# Patient Record
Sex: Female | Born: 1945 | Race: White | Hispanic: No | Marital: Married | Smoking: Former smoker
Health system: Southern US, Community
[De-identification: ages and names within clinical notes are randomized; demographics above are authoritative.]

## PROBLEM LIST (undated history)

## (undated) DIAGNOSIS — E785 Hyperlipidemia, unspecified: Secondary | ICD-10-CM

## (undated) DIAGNOSIS — R232 Flushing: Secondary | ICD-10-CM

## (undated) DIAGNOSIS — Z800 Family history of malignant neoplasm of digestive organs: Secondary | ICD-10-CM

## (undated) DIAGNOSIS — M1990 Unspecified osteoarthritis, unspecified site: Secondary | ICD-10-CM

## (undated) DIAGNOSIS — K635 Polyp of colon: Secondary | ICD-10-CM

## (undated) HISTORY — DX: Family history of malignant neoplasm of digestive organs: Z80.0

## (undated) HISTORY — DX: Hyperlipidemia, unspecified: E78.5

## (undated) HISTORY — DX: Polyp of colon: K63.5

## (undated) HISTORY — DX: Flushing: R23.2

## (undated) HISTORY — DX: Unspecified osteoarthritis, unspecified site: M19.90

---

## 1999-05-22 ENCOUNTER — Encounter (INDEPENDENT_AMBULATORY_CARE_PROVIDER_SITE_OTHER): Payer: Self-pay

## 1999-05-22 ENCOUNTER — Ambulatory Visit (HOSPITAL_COMMUNITY): Admission: RE | Admit: 1999-05-22 | Discharge: 1999-05-22 | Payer: Self-pay | Admitting: Gastroenterology

## 2003-07-08 ENCOUNTER — Ambulatory Visit (HOSPITAL_COMMUNITY): Admission: RE | Admit: 2003-07-08 | Discharge: 2003-07-08 | Payer: Self-pay | Admitting: Gastroenterology

## 2004-08-07 ENCOUNTER — Other Ambulatory Visit: Admission: RE | Admit: 2004-08-07 | Discharge: 2004-08-07 | Payer: Self-pay | Admitting: Family Medicine

## 2005-08-28 ENCOUNTER — Other Ambulatory Visit: Admission: RE | Admit: 2005-08-28 | Discharge: 2005-08-28 | Payer: Self-pay | Admitting: Family Medicine

## 2008-11-23 ENCOUNTER — Other Ambulatory Visit: Admission: RE | Admit: 2008-11-23 | Discharge: 2008-11-23 | Payer: Self-pay | Admitting: Family Medicine

## 2009-12-13 ENCOUNTER — Observation Stay (HOSPITAL_COMMUNITY): Admission: EM | Admit: 2009-12-13 | Discharge: 2009-12-14 | Payer: Self-pay | Admitting: Emergency Medicine

## 2009-12-29 ENCOUNTER — Encounter: Admission: RE | Admit: 2009-12-29 | Discharge: 2009-12-29 | Payer: Self-pay | Admitting: Gastroenterology

## 2010-08-27 LAB — BASIC METABOLIC PANEL
BUN: 10 mg/dL (ref 6–23)
BUN: 14 mg/dL (ref 6–23)
CO2: 25 mEq/L (ref 19–32)
CO2: 25 mEq/L (ref 19–32)
Calcium: 9.1 mg/dL (ref 8.4–10.5)
Calcium: 9.1 mg/dL (ref 8.4–10.5)
Chloride: 113 mEq/L — ABNORMAL HIGH (ref 96–112)
Creatinine, Ser: 0.84 mg/dL (ref 0.4–1.2)
Creatinine, Ser: 0.89 mg/dL (ref 0.4–1.2)
GFR calc Af Amer: >60 mL/min (ref >60)
GFR calc non Af Amer: >60 mL/min (ref >60)
GFR calc non Af Amer: >60 mL/min (ref >60)
Glucose, Bld: 113 mg/dL — ABNORMAL HIGH (ref 70–99)
Glucose, Bld: 98 mg/dL (ref 70–99)
Potassium: 4.4 mEq/L (ref 3.5–5.1)
Sodium: 139 mEq/L (ref 135–145)
Sodium: 143 mEq/L (ref 135–145)

## 2010-08-27 LAB — COMPREHENSIVE METABOLIC PANEL
CO2: 22 mEq/L (ref 19–32)
Creatinine, Ser: 0.76 mg/dL (ref 0.4–1.2)
Glucose, Bld: 161 mg/dL — ABNORMAL HIGH (ref 70–99)

## 2010-08-27 LAB — CBC
HCT: 40.7 % (ref 36.0–46.0)
Hemoglobin: 15.2 g/dL — ABNORMAL HIGH (ref 12.0–15.0)
MCH: 33.5 pg (ref 26.0–34.0)
MCHC: 34.4 g/dL (ref 30.0–36.0)
MCHC: 34.5 g/dL (ref 30.0–36.0)
MCV: 96.6 fL (ref 78.0–100.0)
MCV: 96.9 fL (ref 78.0–100.0)
Platelets: 179 10*3/uL (ref 150–400)
Platelets: 206 10*3/uL (ref 150–400)
RBC: 4.2 MIL/uL (ref 3.87–5.11)
RDW: 13 % (ref 11.5–15.5)
WBC: 5.9 10*3/uL (ref 4.0–10.5)

## 2010-08-27 LAB — POCT CARDIAC MARKERS
CKMB, poc: 1.1 ng/mL (ref 1.0–8.0)
CKMB, poc: <1 ng/mL — ABNORMAL LOW (ref 1.0–8.0)
Myoglobin, poc: 55.5 ng/mL (ref 12–200)
Myoglobin, poc: 56.7 ng/mL (ref 12–200)
Troponin i, poc: <0.05 ng/mL (ref 0.00–0.09)

## 2010-08-27 LAB — DIFFERENTIAL
Basophils Relative: 1 % (ref 0–1)
Lymphocytes Relative: 37 % (ref 12–46)
Lymphs Abs: 2.2 10*3/uL (ref 0.7–4.0)
Monocytes Relative: 8 % (ref 3–12)
Neutro Abs: 3.1 10*3/uL (ref 1.7–7.7)

## 2010-08-27 LAB — LIPID PANEL
Cholesterol: 187 mg/dL (ref 0–200)
LDL Cholesterol: 111 mg/dL — ABNORMAL HIGH (ref 0–99)
Total CHOL/HDL Ratio: 3.5 RATIO
Triglycerides: 112 mg/dL (ref <150)

## 2010-08-27 LAB — APTT: aPTT: 30 seconds (ref 24–37)

## 2010-08-27 LAB — D-DIMER, QUANTITATIVE: D-Dimer, Quant: <0.22 ug/mL-FEU (ref 0.00–0.48)

## 2010-08-27 LAB — HEMOGLOBIN A1C: Mean Plasma Glucose: 123 mg/dL — ABNORMAL HIGH (ref <117)

## 2010-09-27 ENCOUNTER — Other Ambulatory Visit: Payer: Self-pay | Admitting: Gastroenterology

## 2010-09-27 DIAGNOSIS — K7689 Other specified diseases of liver: Secondary | ICD-10-CM

## 2010-10-02 ENCOUNTER — Ambulatory Visit
Admission: RE | Admit: 2010-10-02 | Discharge: 2010-10-02 | Disposition: A | Discharge status: Home / Self Care | Payer: BC Managed Care – PPO | Source: Ambulatory Visit | Attending: Gastroenterology | Admitting: Gastroenterology

## 2010-10-02 DIAGNOSIS — K7689 Other specified diseases of liver: Secondary | ICD-10-CM

## 2010-10-27 NOTE — Op Note (Signed)
NAME:  Abigail Pruitt, Abigail Pruitt                              ACCOUNT NO.:  [id number]   MEDICAL RECORD NO.:  [id number]                   PATIENT TYPE:  AMB   LOCATION:  ENDO                                 FACILITY:  [facility]   PHYSICIAN:  James L. Malon Kindle., M.D.          DATE OF BIRTH:  January 03, 1946   DATE OF PROCEDURE:  07/08/2003  DATE OF DISCHARGE:                                 OPERATIVE REPORT   PROCEDURE:  Colonoscopy.   MEDICATIONS:  1. Fentanyl 75 mcg IV.  2. Versed 8 mg IV.   SCOPE:  Pediatric Olympus colonoscope.   INDICATIONS:  A patient with previous history of polyps.   DESCRIPTION OF PROCEDURE:  The procedure had been explained to the patient  and consent obtained.  With the patient in the left lateral decubitus  position, the Olympus pediatric colonoscope was inserted and advanced.  The  prep was excellent; we were able to reach the cecum using abdominal pressure  and position changes.  The ileocecal valve and appendiceal orifice were  seen.   The patient had diffuse melanosis coli.  Careful examination of the mucosa  on withdrawal showed no polyps in the ascending, transverse, descending or  sigmoid colon.  No significant diverticulosis.  The rectum was free of  polyps.  The scope was withdrawn and the patient tolerated the procedure  well.   ASSESSMENT:  1. No evidence of further polyps in this woman with previous adenomatous     polyps.  (V12.72)  2. Diffuse melanosis coli.   RECOMMENDATIONS:  I will recommend repeating the procedure in five years and  recommend yearly Hemocult's.                                               James L. Malon Kindle., M.D.    Waldron Session  D:  07/08/2003  T:  07/08/2003  Job:  [id number]   cc:   Talmadge Coventry, M.D.  [address]  [place]  [place] [address]  Fax: [phone]

## 2011-03-15 ENCOUNTER — Other Ambulatory Visit: Payer: Self-pay | Admitting: Gastroenterology

## 2011-03-15 DIAGNOSIS — K7689 Other specified diseases of liver: Secondary | ICD-10-CM

## 2011-03-28 ENCOUNTER — Other Ambulatory Visit: Payer: BC Managed Care – PPO

## 2011-03-29 ENCOUNTER — Ambulatory Visit
Admission: RE | Admit: 2011-03-29 | Discharge: 2011-03-29 | Disposition: A | Discharge status: Home / Self Care | Payer: BC Managed Care – PPO | Source: Ambulatory Visit | Attending: Gastroenterology | Admitting: Gastroenterology

## 2011-03-29 DIAGNOSIS — K7689 Other specified diseases of liver: Secondary | ICD-10-CM

## 2012-07-02 ENCOUNTER — Other Ambulatory Visit: Payer: Self-pay | Admitting: Gastroenterology

## 2012-07-02 DIAGNOSIS — K7689 Other specified diseases of liver: Secondary | ICD-10-CM

## 2012-08-27 ENCOUNTER — Other Ambulatory Visit: Payer: BC Managed Care – PPO

## 2012-10-29 ENCOUNTER — Ambulatory Visit
Admission: RE | Admit: 2012-10-29 | Discharge: 2012-10-29 | Disposition: A | Discharge status: Home / Self Care | Payer: Medicare Other | Source: Ambulatory Visit | Attending: Gastroenterology | Admitting: Gastroenterology

## 2012-10-29 DIAGNOSIS — K7689 Other specified diseases of liver: Secondary | ICD-10-CM

## 2013-01-18 IMAGING — US US ABDOMEN COMPLETE
1 series · 13 of 25 positions shown · non-contrast
Comparison: Abdominal ultrasound 12/29/2009.

CLINICAL DATA: Follow up hepatic cysts. Upper abdominal pain.

COMPLETE ABDOMINAL ULTRASOUND 10/02/2010:

[Series 1: us abdomen complete · 0.20mm/px · 13 of 111 slices shown]
[im 1/111]
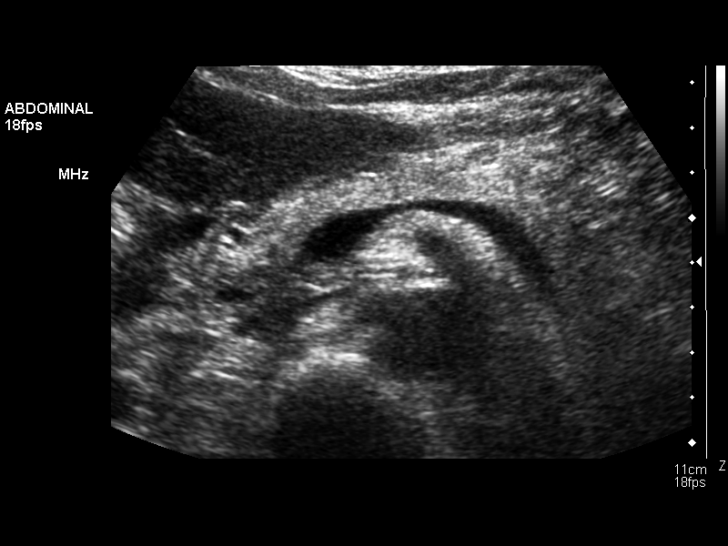
[im 10/111]
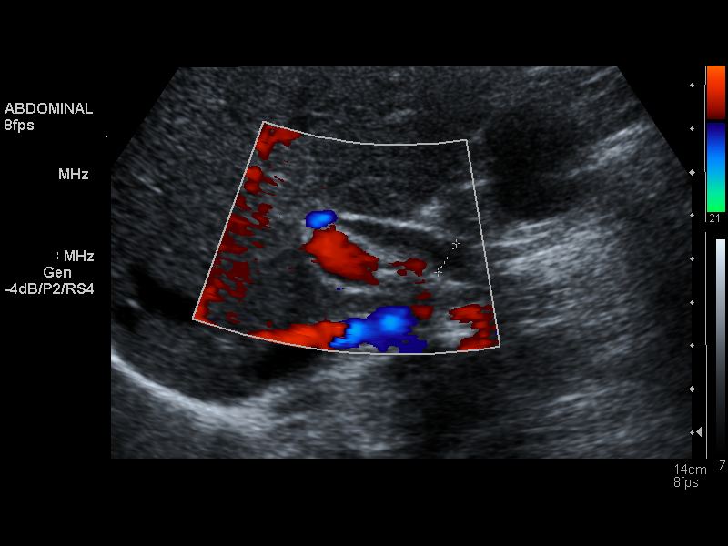
[im 19/111]
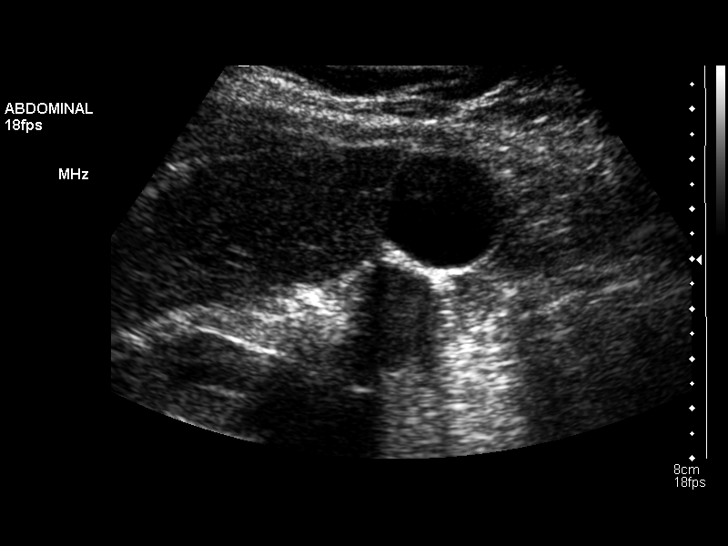
[im 28/111]
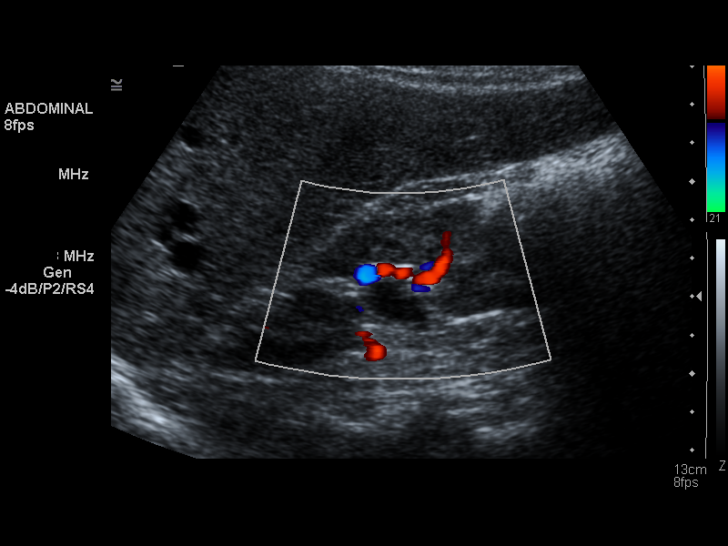
[im 37/111]
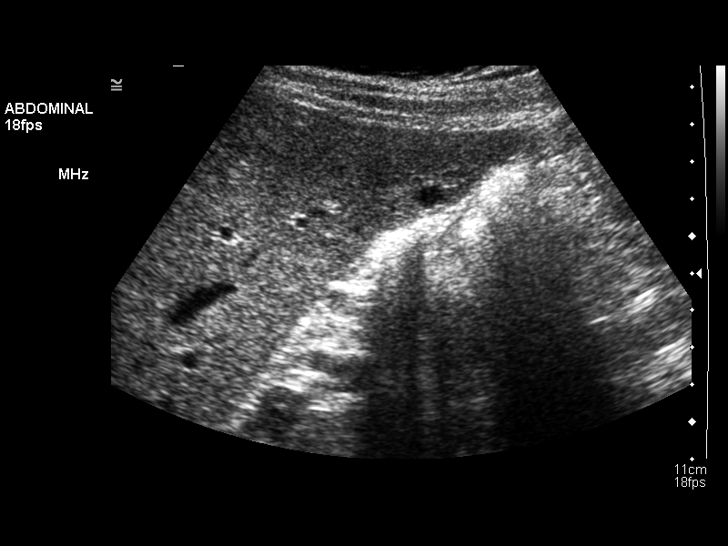
[im 46/111]
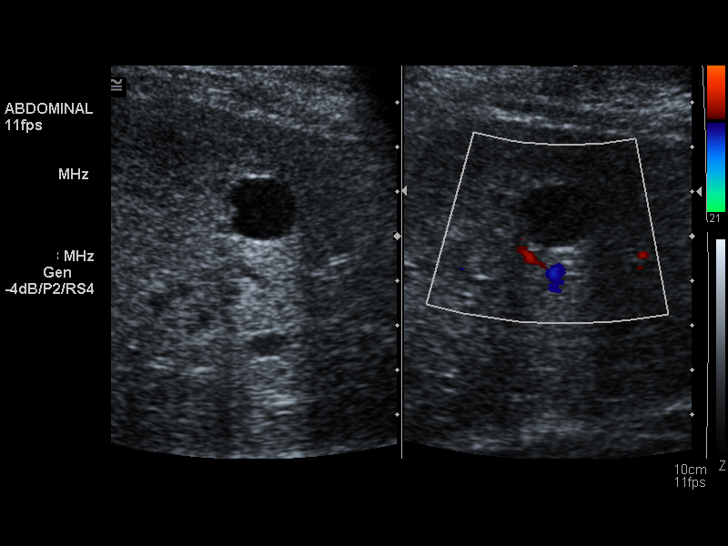
[im 56/111]
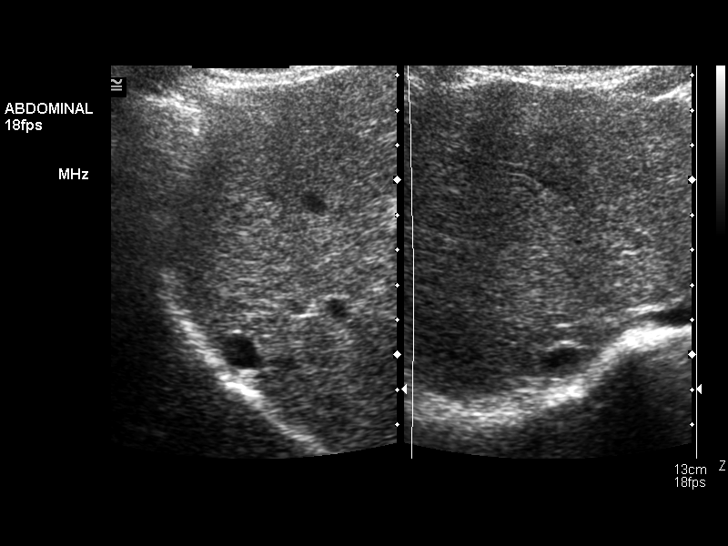
[im 65/111]
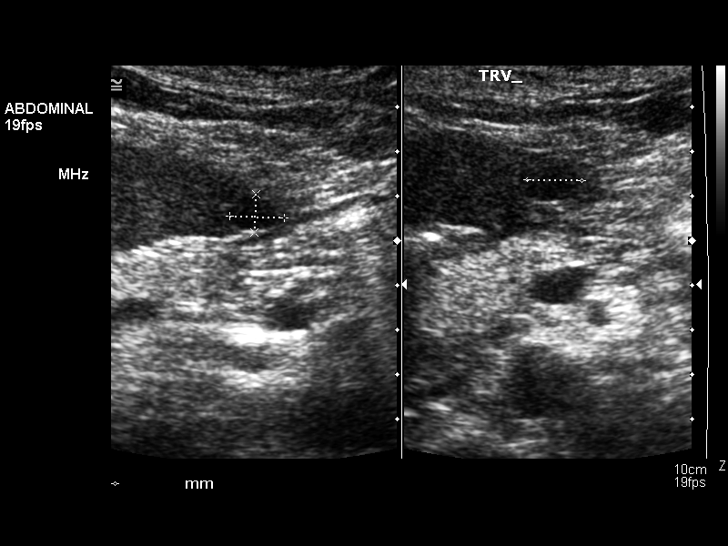
[im 74/111]
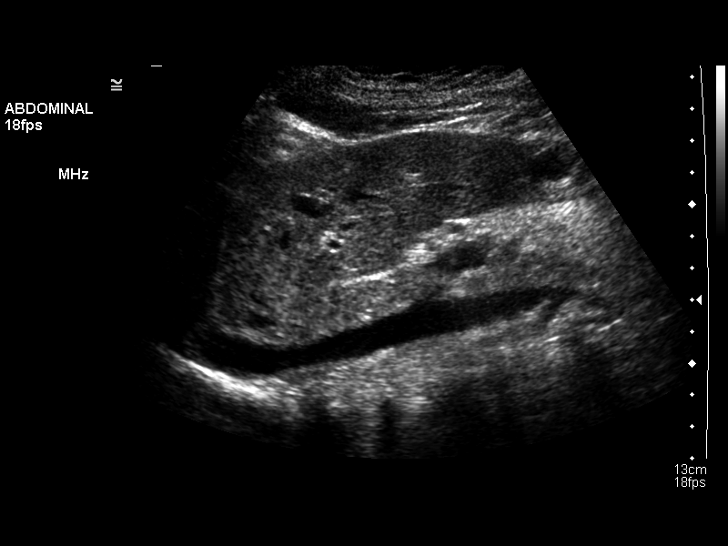
[im 83/111]
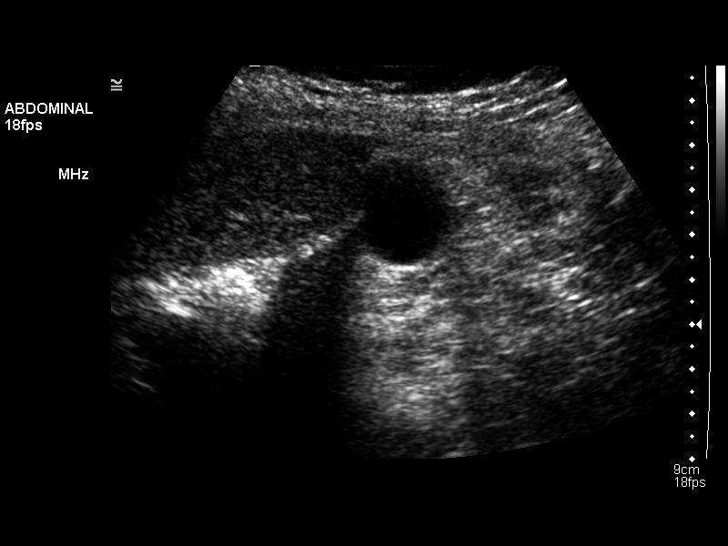
[im 92/111]
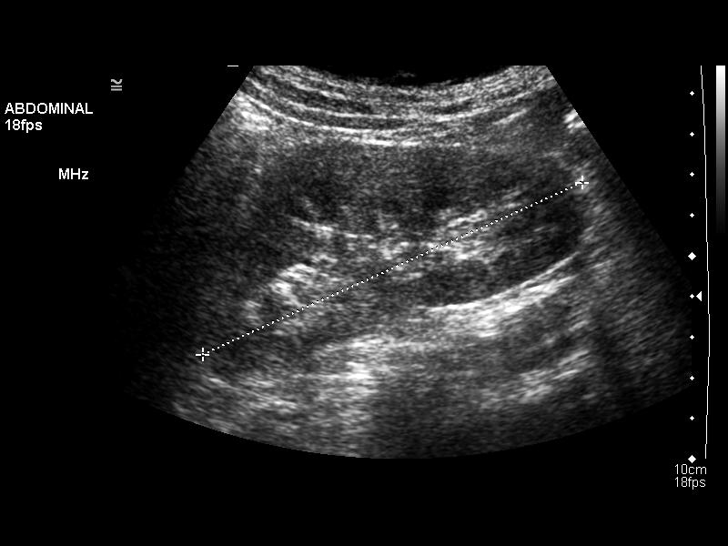
[im 101/111]
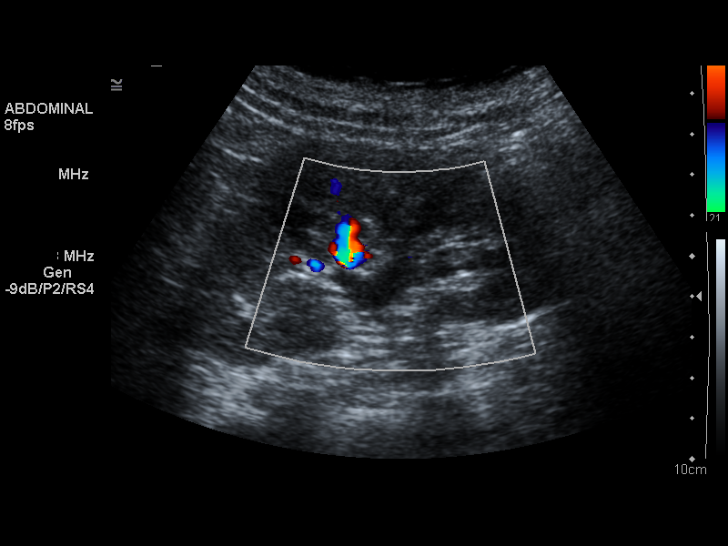
[im 111/111]
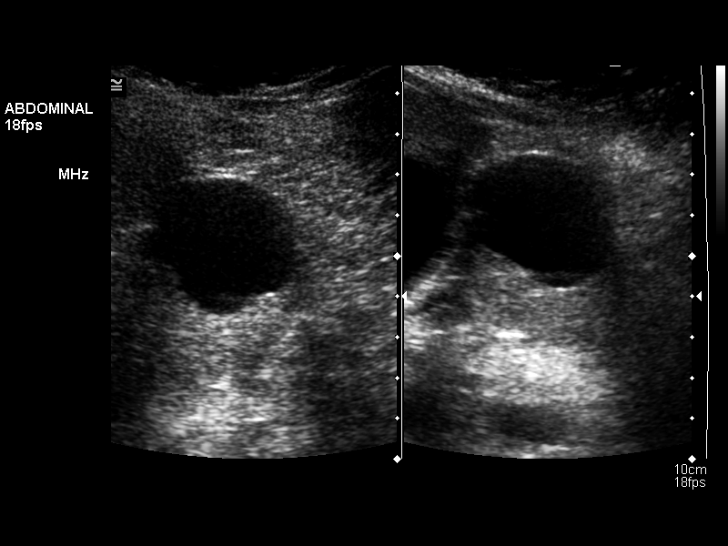

[13 of 25 positions shown; findings below may reference images not displayed]

FINDINGS: Gallbladder:  No shadowing gallstones or echogenic sludge.  No
gallbladder wall thickening or pericholecystic fluid.  Negative
sonographic Murphy's sign according to the ultrasound technologist.

Common bile duct:  Normal in caliber measuring from 3-6 mm in
diameter.  No visible bile duct stone.

Liver:  Multiple cysts, including:
1.  Approximate 3.3 x 3.8 x 3.3 cm exophytic cyst left lobe,
slightly increased in size.
2.  Approximate 1.8  x 1.0 x 1.4 cm cyst lateral segment left lobe,
unchanged.
3.  Approximate 1.2 x 0.9 x 1.2 cm cyst tip of the lateral segment
left lobe, not visualized previously.
4.  Cluster of cysts in the anterior segment right lobe
approximating 1.3 x 1.9 x 2.0 cm, unchanged.
5.  Approximate 1.5 x 1.4 x 1.7 cm cyst posterior segment right
lobe, unchanged.
6.  Approximate 1.2 x 0.9 x 1.3 cm peripheral cyst posterior
segment right lobe, unchanged.
7.  Approximate 2.1 x 1.1 x 1.2 cm peripheral cyst anterior segment
right lobe, not visualized previously.

No solid hepatic parenchymal lesions.  Normal parenchymal
echotexture.  Patent portal vein with hepatopetal flow.

IVC:  Patent.

Pancreas:  Normal size and echotexture without focal parenchymal
abnormality.

Spleen:  Normal size and echotexture without focal parenchymal
abnormality.

Right Kidney:  No hydronephrosis.  Well-preserved cortex.  No
shadowing calculi.  Normal size and parenchymal echotexture without
focal abnormalities. Approximately 10.5 cm length.

Left Kidney:  No hydronephrosis.  Well-preserved cortex.
Approximate 5 mm shadowing calculus arising from the mid kidney.
Normal size and parenchymal echotexture without focal parenchymal
abnormality.  Approximately 10.4 cm length.

Abdominal aorta:  Normal in caliber throughout its visualized
course in the abdomen.
IMPRESSION: 1.  Multiple hepatic cysts as detailed above.  No solid hepatic
parenchymal lesions.
2.  Approximate 5 mm non-obstructing calculus within the mid left
kidney.
3.  Otherwise normal abdominal ultrasound.

## 2013-07-15 IMAGING — US US ABDOMEN COMPLETE
1 series · 14 of 25 positions shown · non-contrast
Comparison: 10/02/2010 and 12/29/2009.

CLINICAL DATA: Follow-up of hepatic cysts.

ABDOMEN ULTRASOUND
TECHNIQUE: Complete abdominal ultrasound examination was performed
including evaluation of the liver, gallbladder, bile ducts,
pancreas, kidneys, spleen, IVC, and abdominal aorta.

[Series 1: us abdomen complete · 0.26mm/px · 14 of 104 slices shown]
[im 1/104]
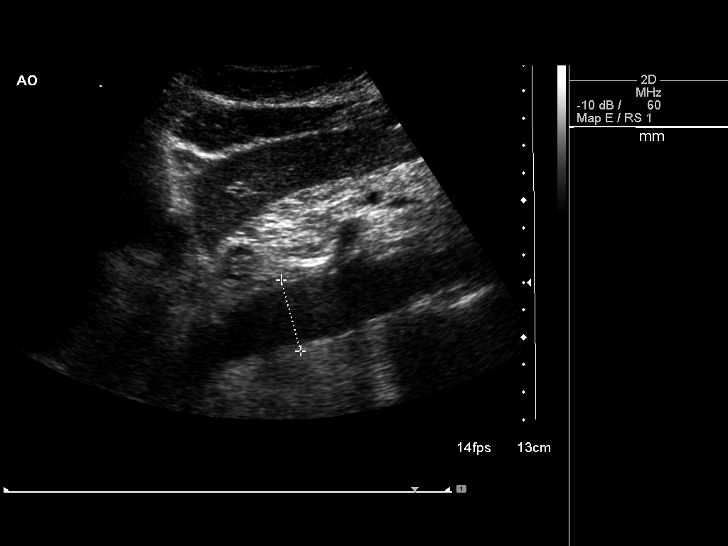
[im 9/104]
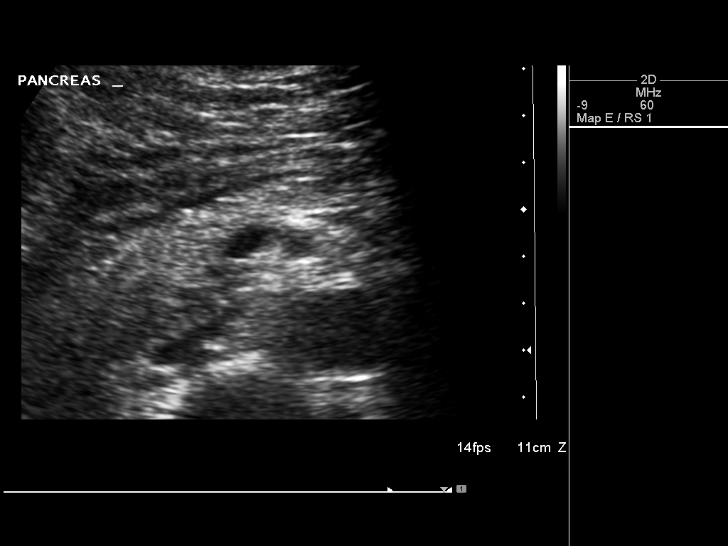
[im 18/104]
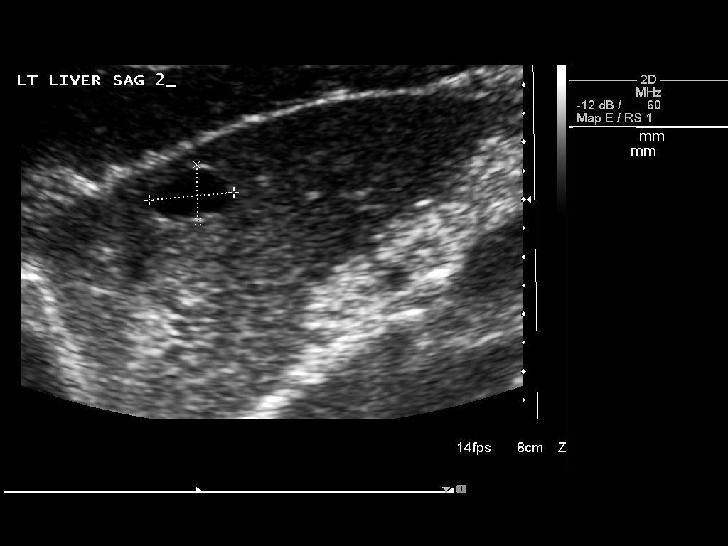
[im 26/104]
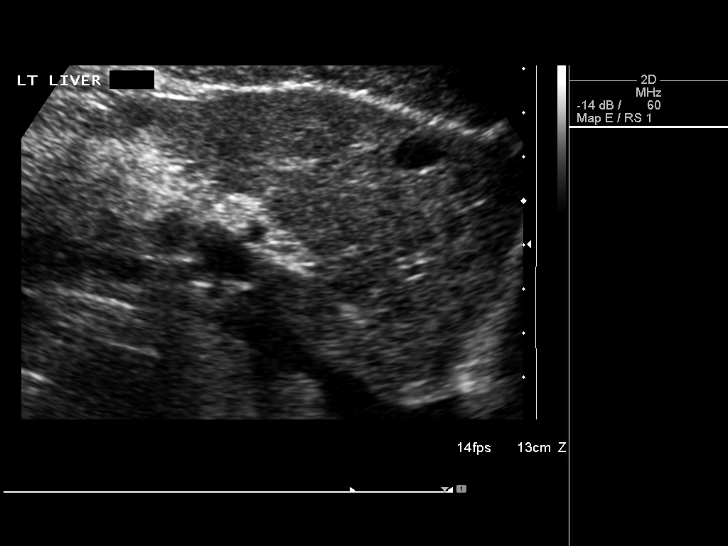
[im 35/104]
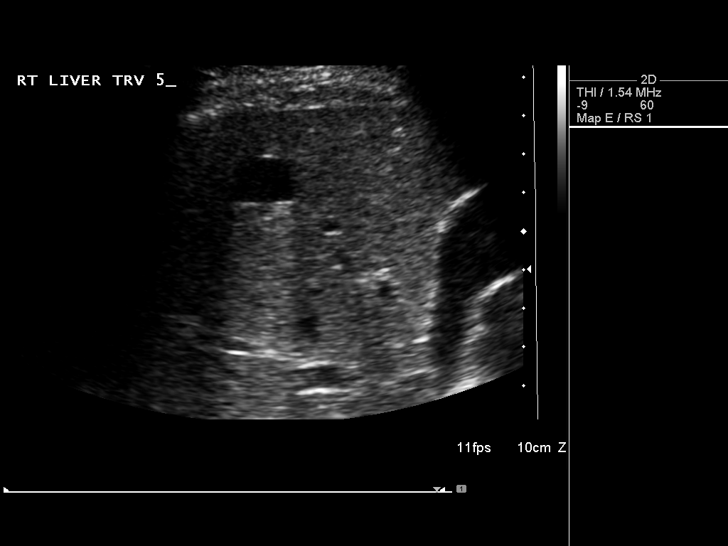
[im 39/104]
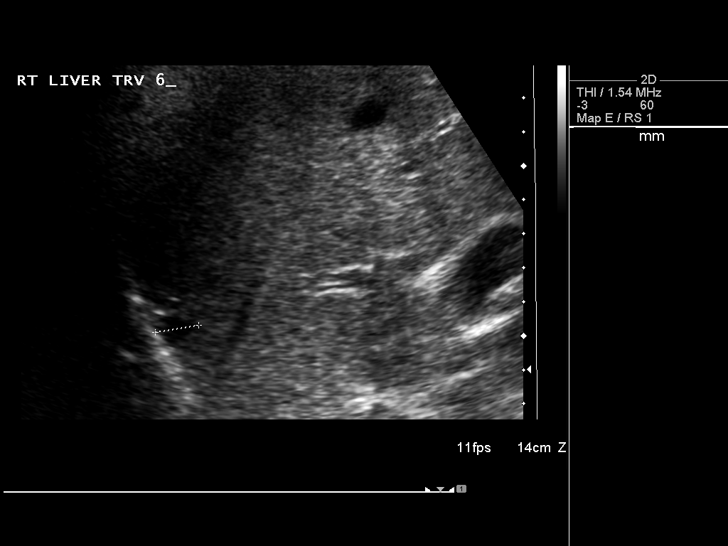
[im 48/104]
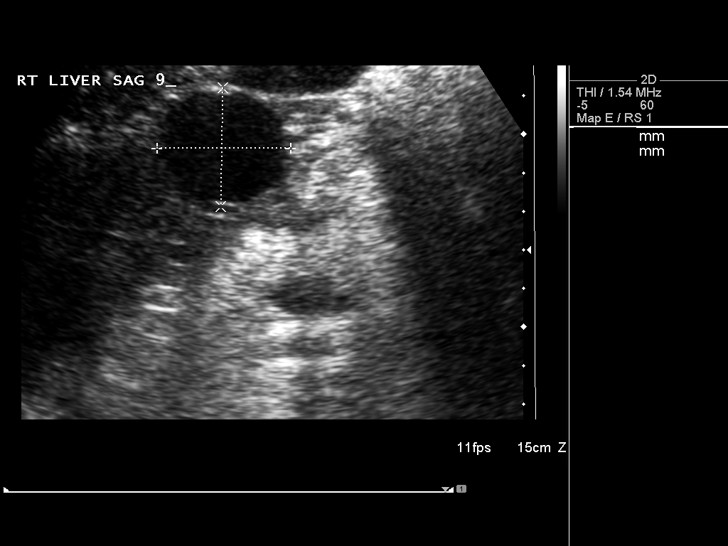
[im 56/104]
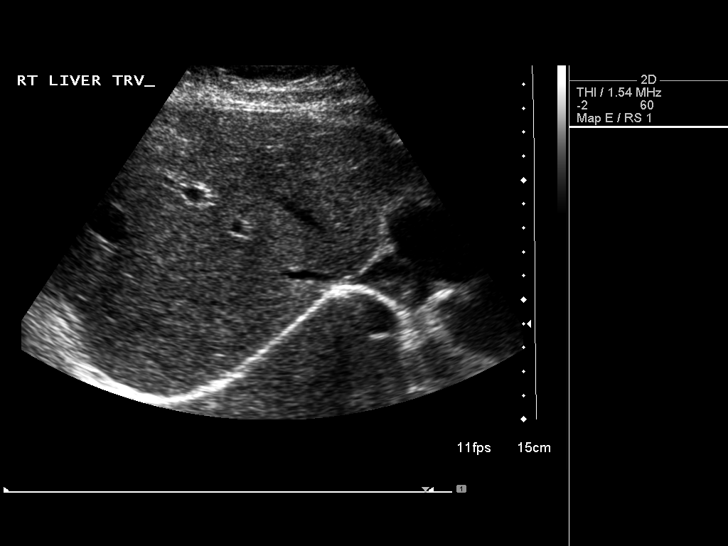
[im 65/104]
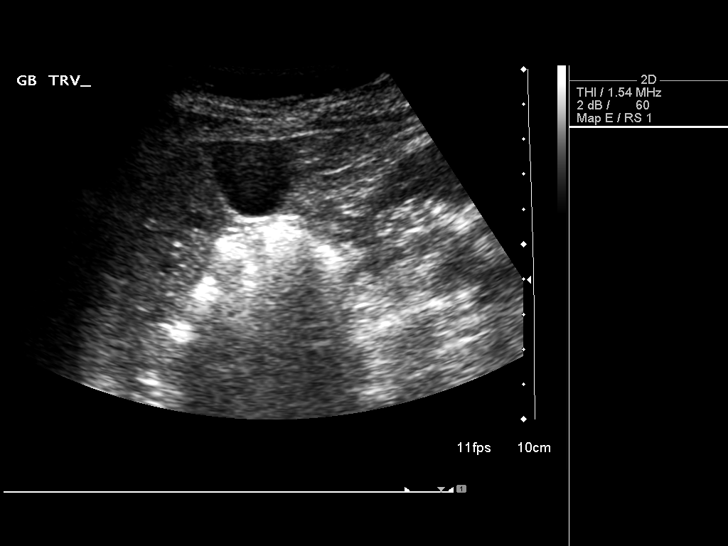
[im 69/104]
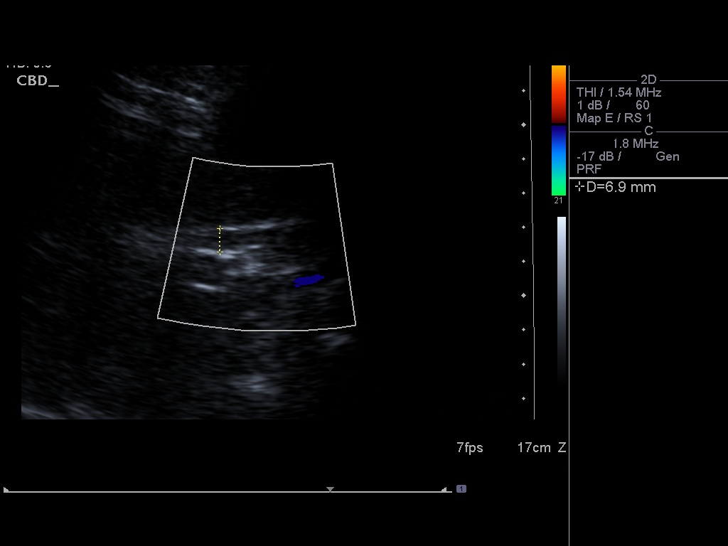
[im 78/104]
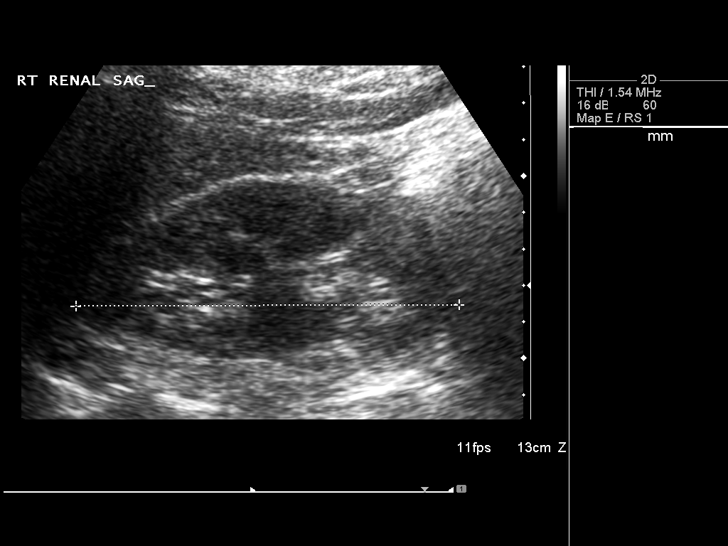
[im 86/104]
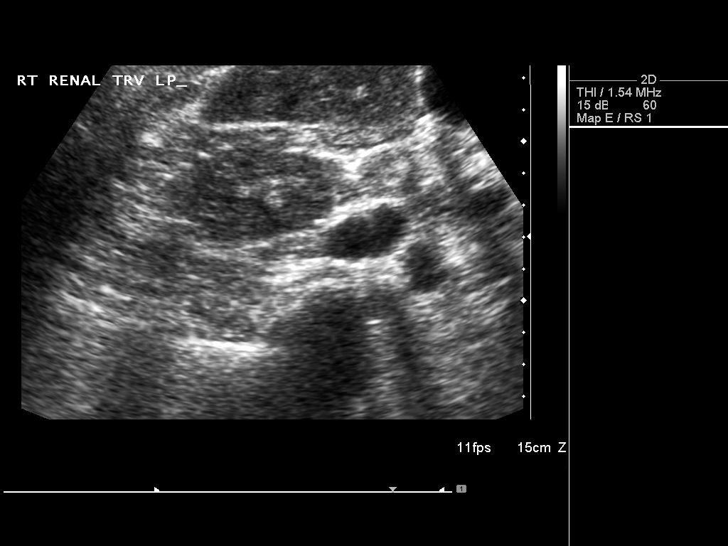
[im 95/104]
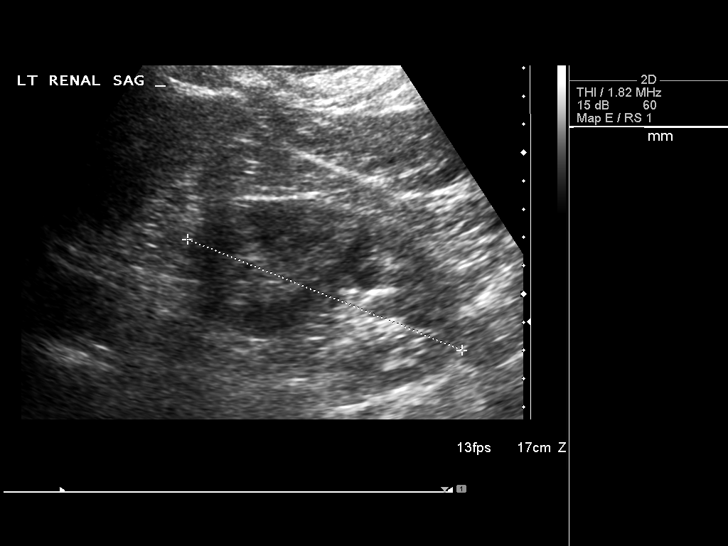
[im 104/104]
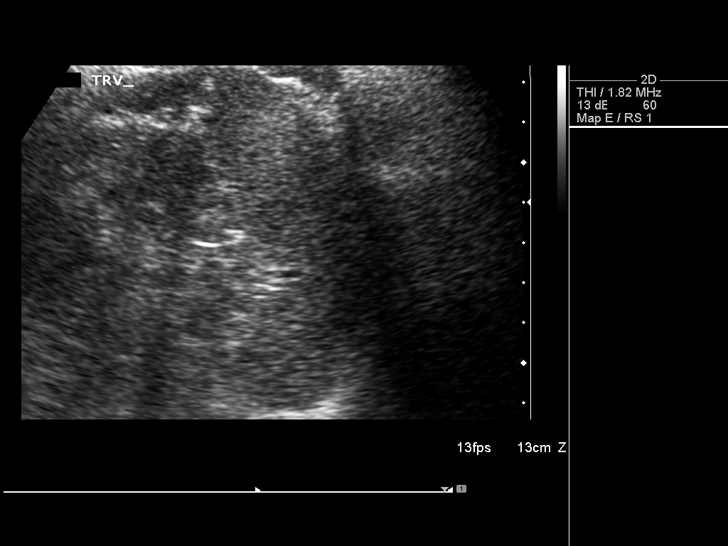

[14 of 25 positions shown; findings below may reference images not displayed]

FINDINGS: Gallbladder:  No shadowing gallstones or echogenic sludge.  No
gallbladder wall thickening or pericholecystic fluid.  Negative
sonographic Murphy's sign according to the ultrasound technologist.

Common Bile Duct:  Maximal diameter of 7 mm.

Liver:  Multiple bilateral hepatic cysts are again visualized which
have a benign appearance.  The largest measures 3.5 cm.

IVC:  Patent throughout its visualized course in the abdomen.

Pancreas:  Although the pancreas is difficult to visualize in its
entirety, no focal pancreatic abnormality is identified.

Spleen:  The spleen is of normal echotexture and size

Kidneys:  Both kidneys measure approximately 10.5 cm.  Both have a
normal sonographic appearance.

Abdominal Aorta:  Mild atherosclerosis.  No evidence of aortic
aneurysm.
IMPRESSION: Stable sonographic appearance of multiple hepatic cysts.

## 2015-02-15 IMAGING — US US ABDOMEN COMPLETE
1 series · 14 of 25 positions shown · non-contrast
Comparison: Ultrasound abdomen of 03/29/2011

CLINICAL DATA: Hepatic cysts, follow-up

COMPLETE ABDOMINAL ULTRASOUND

[Series 1: us abdomen complete · 0.26mm/px · 14 of 103 slices shown]
[im 1/103]
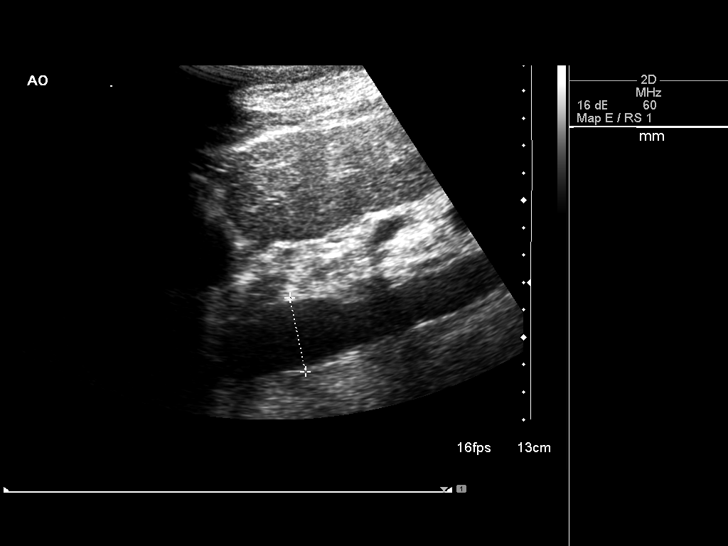
[im 9/103]
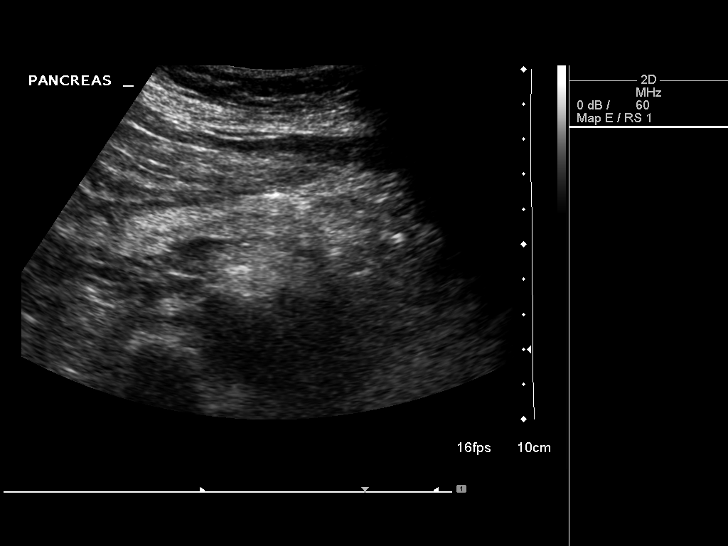
[im 18/103]
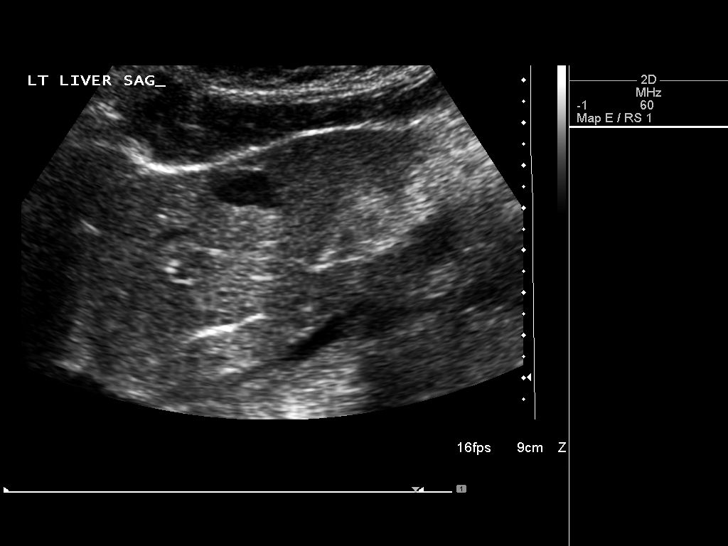
[im 26/103]
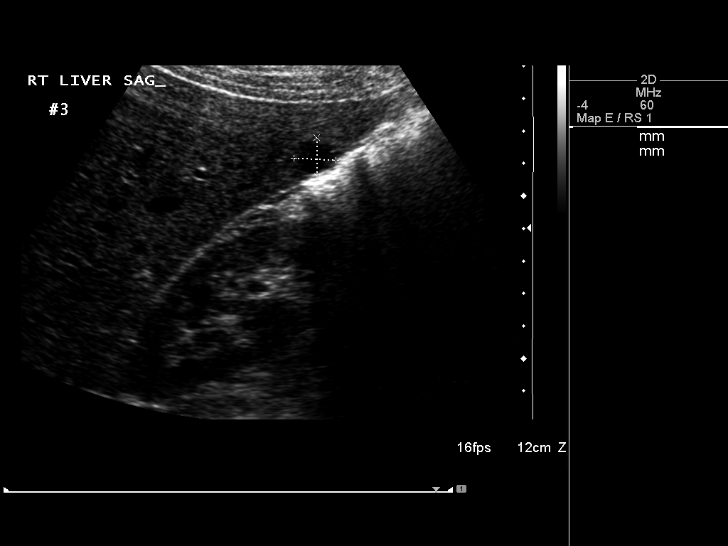
[im 35/103]
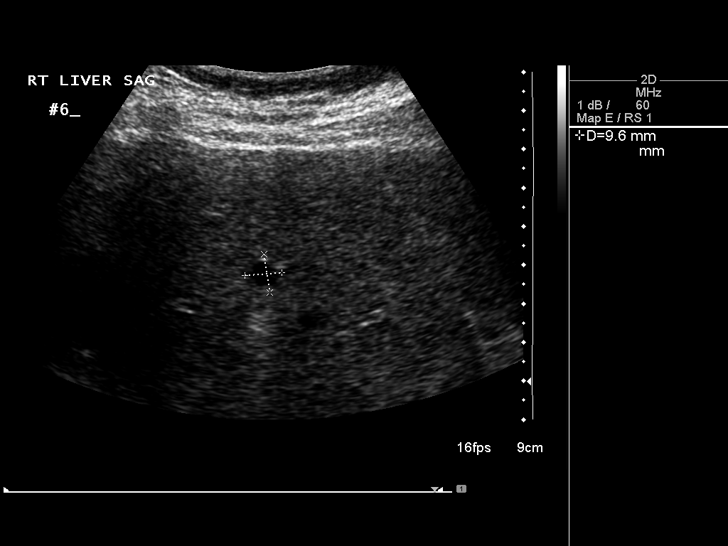
[im 39/103]
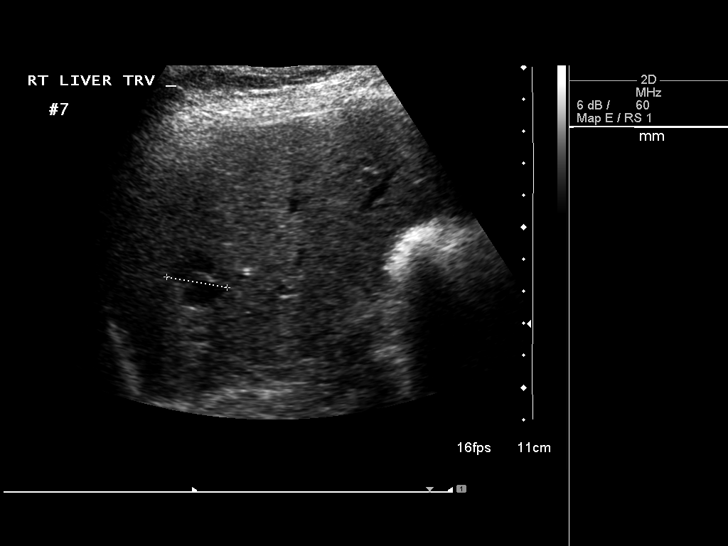
[im 47/103]
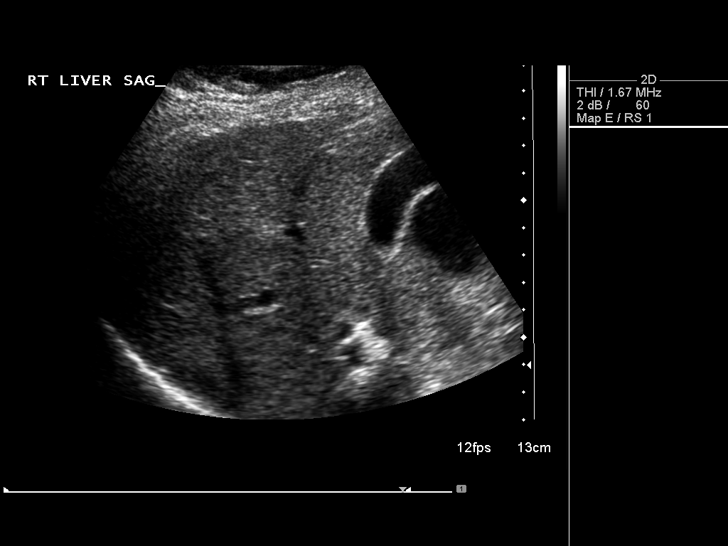
[im 56/103]
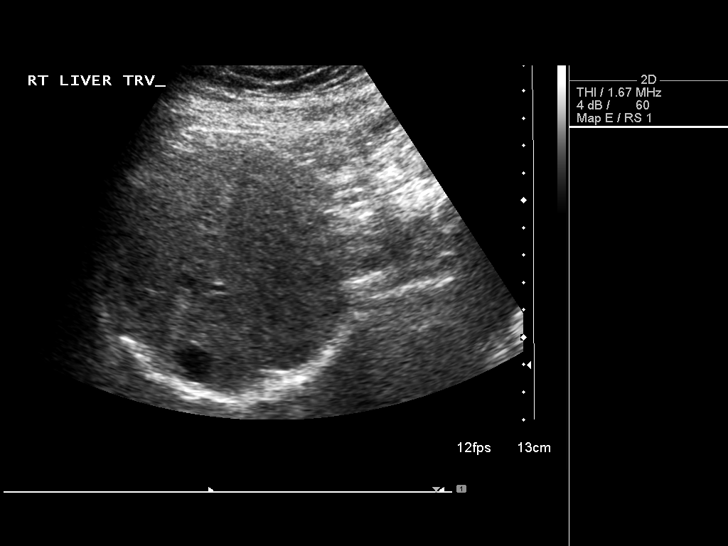
[im 64/103]
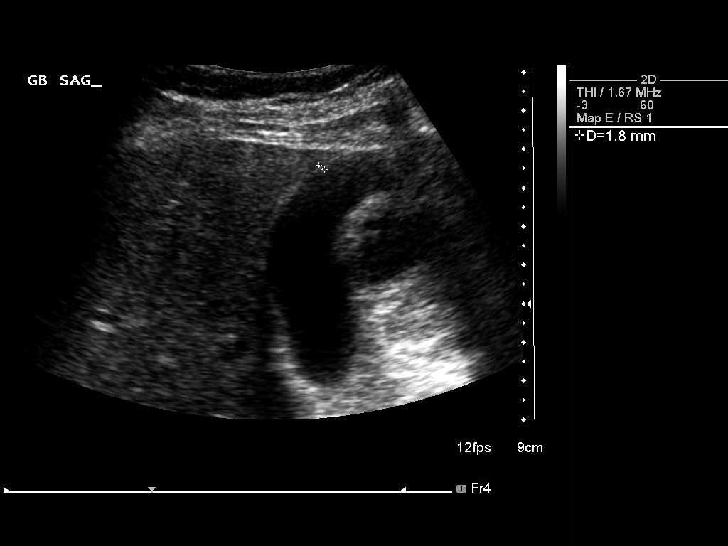
[im 69/103]
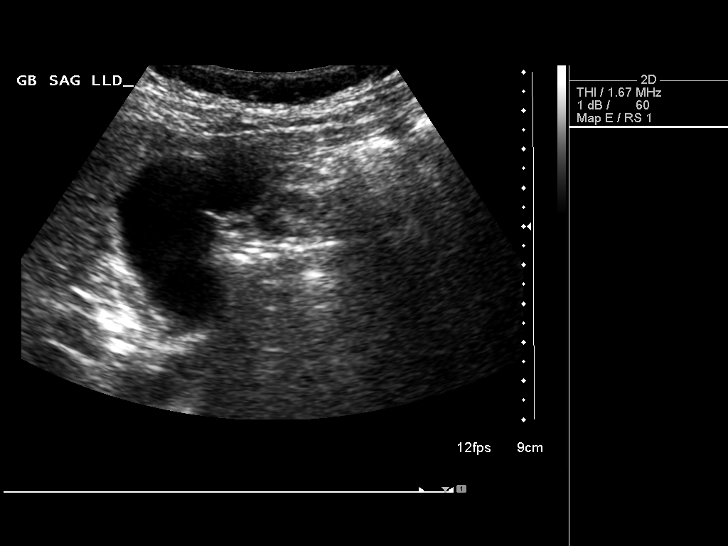
[im 77/103]
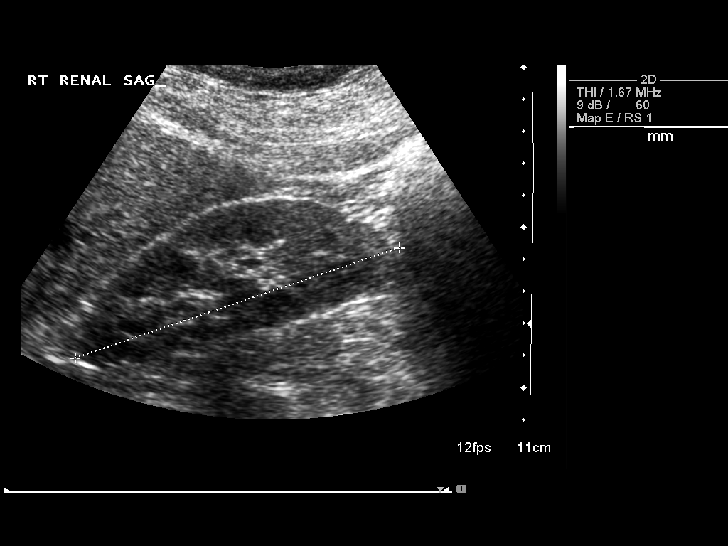
[im 86/103]
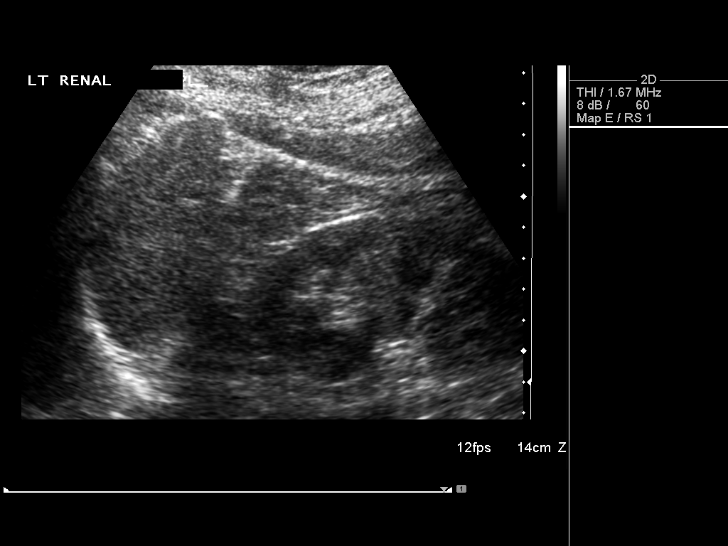
[im 94/103]
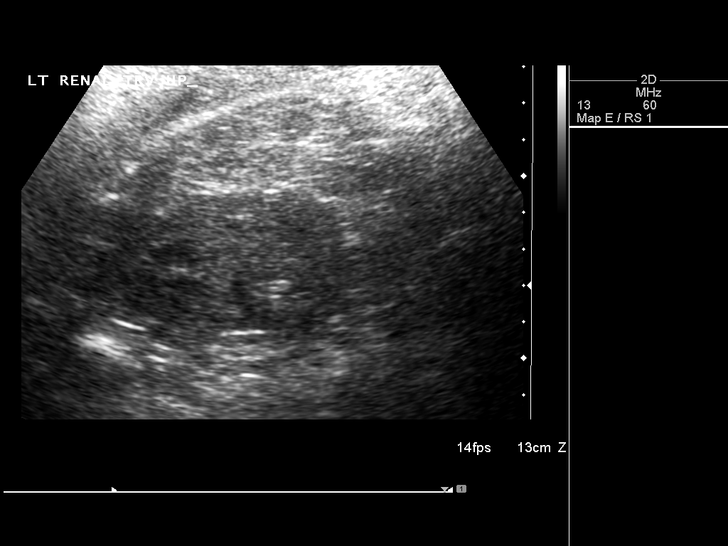
[im 103/103]
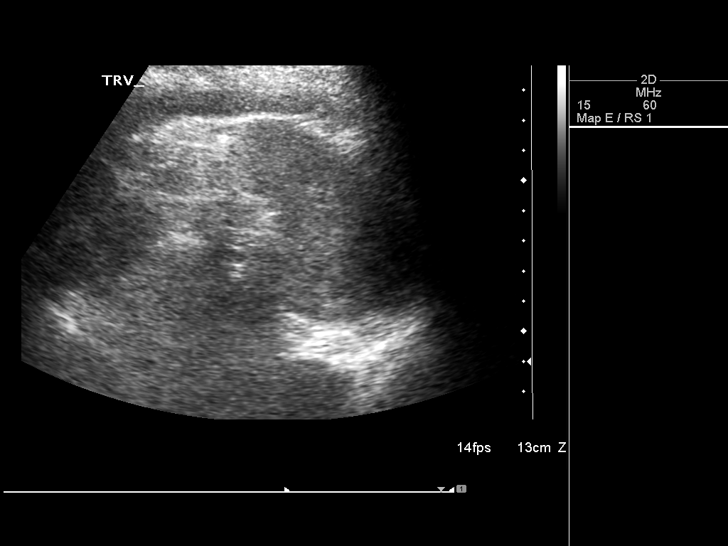

[14 of 25 positions shown; findings below may reference images not displayed]

FINDINGS: Gallbladder:  The gallbladder is well visualized and no gallstones
are noted.  There is no pain over the gallbladder with compression.

Common bile duct:  The common bile duct is normal measuring 3.8 mm
in diameter.

Liver:  As noted previously multiple hepatic cysts are present.
The largest cyst is in the right lobe extending exophytically
measuring 3.4 x 4.0 x 3.9 cm compared to a maximum diameter
previously of 3.5 cm.  A cyst in the right lobe measures 1.9 x
x 1.6 cm, with additional septated cyst in the right lobe of 1.6 x
2.3 x 1.9 cm.  No enlarging cyst is seen.

IVC:  Appears normal.

Pancreas:  No focal abnormality seen.

Spleen:  The spleen is normal measuring 5.1 cm sagittally.

Right Kidney:  No hydronephrosis is seen.  The right kidney
measures 10.7 cm sagittally.

Left Kidney:  No hydronephrosis is noted.  The left kidney measures
10.5 cm.

Abdominal aorta:  The abdominal aorta is normal in caliber.
IMPRESSION: 1.  Stable multiple hepatic cysts.  No enlarging cyst is seen.
2.  No gallstones.

## 2020-11-04 ENCOUNTER — Ambulatory Visit (HOSPITAL_COMMUNITY)
Admission: EM | Admit: 2020-11-04 | Discharge: 2020-11-04 | Disposition: A | Discharge status: Home / Self Care | Payer: Medicare Other

## 2020-11-04 ENCOUNTER — Encounter (HOSPITAL_COMMUNITY): Payer: Self-pay | Admitting: Emergency Medicine

## 2020-11-04 ENCOUNTER — Other Ambulatory Visit: Payer: Self-pay

## 2020-11-04 DIAGNOSIS — R140 Abdominal distension (gaseous): Secondary | ICD-10-CM

## 2020-11-04 DIAGNOSIS — K219 Gastro-esophageal reflux disease without esophagitis: Secondary | ICD-10-CM

## 2020-11-04 DIAGNOSIS — R110 Nausea: Secondary | ICD-10-CM

## 2020-11-04 MED ORDER — ONDANSETRON 4 MG PO TBDP
ORAL_TABLET | ORAL | Status: AC
  Filled 2020-11-04: qty 1

## 2020-11-04 MED ORDER — ONDANSETRON 4 MG PO TBDP: 4.0000 mg | ORAL_TABLET | Freq: Three times a day (TID) | ORAL | 0 refills | Status: AC | PRN

## 2020-11-04 MED ORDER — ONDANSETRON 4 MG PO TBDP
4.0000 mg | ORAL_TABLET | Freq: Once | ORAL | Status: AC
  Administered 2020-11-04: 4 mg via ORAL

## 2020-11-04 NOTE — ED Provider Notes (Signed)
[facility]    CSN: [id number] Arrival date & time: 11/04/20  1210      History   Chief Complaint Chief Complaint  Patient presents with  . Emesis  . Nausea  . Headache    HPI Abigail Pruitt is a 75 y.o. female.   Patient here for evaluation of bad taste in her mouth, nausea, and feeling like her food is "sitting in her stomach". Reports having a history acid reflux that was not able to be treated with OTC medicatiosn. Reports having some bloating.  Reports still having bowel movements but states feeling that she is not able to have a "full bowel movement."  Denies any trauma, injury, or other precipitating event.  Denies any specific alleviating or aggravating factors.  Denies any fevers, chest pain, shortness of breath, N/V/D, numbness, tingling, weakness.  Patient lives in [place] but is here on vacation for the next few weeks. Most recent colonoscopy was about 1 year ago and did not show any concerning findings.  The history is provided by the patient.  Emesis Associated symptoms: headaches   Headache Associated symptoms: nausea and vomiting     Past Medical History:  Diagnosis Date  . Colon polyps   . Family hx of colon cancer   . Hot flashes    Severe hot flashes  . Hyperlipidemia   . Osteoarthritis     There are no problems to display for this patient.   History reviewed. No pertinent surgical history.  OB History   No obstetric history on file.      Home Medications    Prior to Admission medications   Medication Sig Start Date End Date Taking? Authorizing Provider  ondansetron (ZOFRAN ODT) 4 MG disintegrating tablet Take 1 tablet (4 mg total) by mouth every 8 (eight) hours as needed for nausea or vomiting. 11/04/20  Yes Ivette Loyal, NP  b complex vitamins tablet Take 1 tablet by mouth daily. B Complex Tablet 1 tablet once a day    [provider]  BIOTIN PO Take by mouth. Biotin 5000 mcg Tablet 1 tablet Once a day    [provider]  Calcium Carbonate (CALCIUM 600 PO) Take by mouth. Calcium 600 MG Tablet 1 tablet with meals once a day    [provider]  cetirizine (ZYRTEC) 10 MG tablet Take 10 mg by mouth daily. Zyrtec Allergy 10 MG Tablet 1 tablet as needed Once a day    [provider]  Digestive Enzymes (DIGESTIVE ENZYME PO) Take by mouth. Digestive Enzymes Tablet 4 capsules 2-3 times a day-usually before meals    [provider]  OVER THE COUNTER MEDICATION 1000 mg tumeric once a day    [provider]  Probiotic Product (PROBIOTIC DAILY PO) Take by mouth. Probiotic Capsule once a day    [provider]  rosuvastatin (CRESTOR) 5 MG tablet Take 5 mg by mouth 3 (three) times a week. 10/04/20   [provider]    Family History Family History  Problem Relation Age of Onset  . Arthritis Mother   . Osteoporosis Mother   . Colon polyps Mother   . Cancer Father        Pancreatic    Social History Social History   Tobacco Use  . Smoking status: Former Games developer  . Smokeless tobacco: Never Used  Substance Use Topics  . Alcohol use: Yes     Allergies   Codeine   Review of Systems Review  of Systems  Gastrointestinal: Positive for abdominal distention, nausea and vomiting.  Neurological: Positive for headaches.  All other systems reviewed and are negative.    Physical Exam Triage Vital Signs ED Triage Vitals  Enc Vitals Group     BP 11/04/20 1238 (!) 142/90     Pulse Rate 11/04/20 1238 71     Resp 11/04/20 1238 16     Temp 11/04/20 1238 98.3 F (36.8 C)     Temp Source 11/04/20 1238 Oral     SpO2 11/04/20 1238 95 %     Weight --      Height --      Head Circumference --      Peak Flow --      Pain Score 11/04/20 1235 0     Pain Loc --      Pain Edu? --      Excl. in GC? --    No data found.  Updated Vital Signs BP (!) 142/90 (BP Location: Left Arm)   Pulse 71   Temp 98.3 F (36.8 C) (Oral)   Resp 16   SpO2 95%    Visual Acuity Right Eye Distance:   Left Eye Distance:   Bilateral Distance:    Right Eye Near:   Left Eye Near:    Bilateral Near:     Physical Exam Vitals and nursing note reviewed.  Constitutional:      General: She is not in acute distress.    Appearance: Normal appearance. She is not ill-appearing, toxic-appearing or diaphoretic.  HENT:     Head: Normocephalic and atraumatic.     Mouth/Throat:     Mouth: Mucous membranes are moist.  Eyes:     Extraocular Movements: Extraocular movements intact.     Conjunctiva/sclera: Conjunctivae normal.  Cardiovascular:     Rate and Rhythm: Normal rate.     Pulses: Normal pulses.  Pulmonary:     Effort: Pulmonary effort is normal.  Abdominal:     General: Abdomen is flat. Bowel sounds are normal.     Palpations: Abdomen is soft.     Tenderness: There is no abdominal tenderness.  Musculoskeletal:        General: Normal range of motion.     Cervical back: Normal range of motion.  Skin:    General: Skin is warm and dry.  Neurological:     General: No focal deficit present.     Mental Status: She is alert and oriented to person, place, and time.  Psychiatric:        Mood and Affect: Mood normal.      UC Treatments / Results  Labs (all labs ordered are listed, but only abnormal results are displayed) Labs Reviewed - No data to display  EKG   Radiology No results found.  Procedures Procedures (including critical care time)  Medications Ordered in UC Medications  ondansetron (ZOFRAN-ODT) disintegrating tablet 4 mg (4 mg Oral Given 11/04/20 1245)    Initial Impression / Assessment and Plan / UC Course  I have reviewed the triage vital signs and the nursing notes.  Pertinent labs & imaging results that were available during my care of the patient were reviewed by me and considered in my medical decision making (see chart for details).    Assessment negative for red flags or concerns.  Zofran was given in office  and helped relieve some feelings of nausea.  Zofran every 8 hours as needed for nausea and vomiting.  Encouraged patient to  try a daily miralax to help with bloating and to help have a "full bowel movement."  Patient may continue to use Tums and may try OTC medication such as omeprazole.  Encouraged fluids and rest.  Discussed following up with the ED if symptoms do not resolve in the next few days.  Recommend follow up with PCP as soon as possible.  Final Clinical Impressions(s) / UC Diagnoses   Final diagnoses:  Nausea  Bloating  Gastroesophageal reflux disease without esophagitis     Discharge Instructions     Take the zofran as needed for nausea and vomiting.  Consider try miralax once a day until you have a full bowel movement.   Continue to take the Tums for acid reflux.  You can add in an OTC medication, such as omeprazole.   Drink plenty of fluids, especially water.   If you are unable to have a bowel movement after a few days, develop projectile vomiting, have the worst headache of your life, or severe abdominal pain, go to the Emergency Department for further evaluation.    Return or go to the Emergency Department if symptoms worsen or do not improve in the next few days.     ED Prescriptions    Medication Sig Dispense Auth. Provider   ondansetron (ZOFRAN ODT) 4 MG disintegrating tablet Take 1 tablet (4 mg total) by mouth every 8 (eight) hours as needed for nausea or vomiting. 20 tablet Ivette Loyal, NP     PDMP not reviewed this encounter.   Ivette Loyal, NP 11/04/20 [phone]

## 2020-11-04 NOTE — ED Triage Notes (Signed)
Pt presents with nausea, feeling of needing to vomit and headaches xs 3 weeks. States feels as if her food is stopping in GI tract and not metabolizing. States has had hx in the past of reflux. States is having normal BMs but does not feel that GI tract is emptying fully.

## 2020-11-04 NOTE — Discharge Instructions (Addendum)
Take the zofran as needed for nausea and vomiting.  Consider try miralax once a day until you have a full bowel movement.   Continue to take the Tums for acid reflux.  You can add in an OTC medication, such as omeprazole.   Drink plenty of fluids, especially water.   If you are unable to have a bowel movement after a few days, develop projectile vomiting, have the worst headache of your life, or severe abdominal pain, go to the Emergency Department for further evaluation.    Return or go to the Emergency Department if symptoms worsen or do not improve in the next few days.

## 2021-10-06 ENCOUNTER — Other Ambulatory Visit: Payer: Self-pay | Admitting: Family Medicine

## 2021-10-06 DIAGNOSIS — R110 Nausea: Secondary | ICD-10-CM

## 2021-10-13 ENCOUNTER — Ambulatory Visit
Admission: RE | Admit: 2021-10-13 | Discharge: 2021-10-13 | Disposition: A | Discharge status: Home / Self Care | Payer: Medicare Other | Source: Ambulatory Visit | Attending: Family Medicine | Admitting: Family Medicine

## 2021-10-13 DIAGNOSIS — R110 Nausea: Secondary | ICD-10-CM

## 2021-10-23 ENCOUNTER — Ambulatory Visit: Payer: Medicare Other | Attending: Sports Medicine

## 2021-10-23 DIAGNOSIS — M25551 Pain in right hip: Secondary | ICD-10-CM | POA: Diagnosis not present

## 2021-10-23 DIAGNOSIS — R262 Difficulty in walking, not elsewhere classified: Secondary | ICD-10-CM

## 2021-10-23 DIAGNOSIS — M5459 Other low back pain: Secondary | ICD-10-CM

## 2021-10-23 DIAGNOSIS — M6281 Muscle weakness (generalized): Secondary | ICD-10-CM

## 2021-10-23 DIAGNOSIS — M545 Low back pain, unspecified: Secondary | ICD-10-CM | POA: Diagnosis present

## 2021-10-23 NOTE — Therapy (Signed)
?OUTPATIENT PHYSICAL THERAPY THORACOLUMBAR EVALUATION ? ? ?Patient Name: Abigail Pruitt ?MRN: [id number] ?DOB:June 23, 1945, 76 y.o., female ?Today's Date: 10/23/2021 ? ? PT End of Session - 10/23/21 0934   ? ? Visit Number 1   ? Date for PT Re-Evaluation 12/18/21   ? Authorization Type Medicare A and B   ? Progress Note Due on Visit 10   ? PT Start Time [phone]   ? PT Stop Time 1015   ? PT Time Calculation (min) 44 min   ? Activity Tolerance Patient tolerated treatment well   ? Behavior During Therapy [facility] for tasks assessed/performed   ? ?  ?  ? ?  ? ? ?Past Medical History:  ?Diagnosis Date  ? Colon polyps   ? Family hx of colon cancer   ? Hot flashes   ? Severe hot flashes  ? Hyperlipidemia   ? Osteoarthritis   ? ?History reviewed. No pertinent surgical history. ?Patient Active Problem List  ? Diagnosis Date Noted  ? Other low back pain 10/23/2021  ? Pain in right hip 10/23/2021  ? ? ?PCP: Gerald Stabs, MD   ? ?REFERRING PROVIDER: Inez Catalina, MD ? ?REFERRING DIAG: M54.50 (ICD-10-CM) - Low back pain, unspecified  ? ?THERAPY DIAG:  ?Other low back pain ? ?Pain in right hip ? ?Muscle weakness (generalized) ? ?Difficulty in walking, not elsewhere classified ? ?ONSET DATE: 09/11/21 ? ?SUBJECTIVE:                                                                                                                                                                                          ? ?SUBJECTIVE STATEMENT: ?Patient states she has spinal stenosis and has managed her condition fairly well over time.  She has recently started having some increased symptoms when she was golfing and felt some pain shoot down her leg.  She has undergone injections for her back several times but recently had an injection in her right hip which helped but she is continuing to have pain. She enjoys playing golf, walking, water aerobics. She hopes to eliminate her pain and be able to continue being active.  ?PERTINENT HISTORY:  ?She has been  seeing pain management, she has degenerative disease of the lumbar spine, the patient is a s/p epidural block by Dr. Dollene Primrose.  Back pain is improved. ? ?  She has history of cervical spinal stenosis and is status post cervical spine fusion with subsequent complications including vocal cord dysfunction.  This has been addressed by ENT requiring thyroplasty. ?Patient also had dilatation of esophagus which also improved dysphagia. ?Removal of fixation plates done  [id number], Dr Thayer [place], no improvement on hoarseness noticed by patient with this approach reason why thyroplasty was done ? ?PAIN:  ?Are you having pain? Yes: NPRS scale: 0/10 ?Pain location: back and right hip (occasionally left) ?Pain description: none now but recently 6/10 ?Aggravating factors: lying down, sitting ?Relieving factors: topical analgesics ? ? ?PRECAUTIONS: Other: osteopenia ? ?WEIGHT BEARING RESTRICTIONS No ? ?FALLS:  ?Has patient fallen in last 6 months? No ? ?LIVING ENVIRONMENT: ?Lives with: lives with their family and lives with their spouse ?Lives in: House/apartment ?Stairs: Yes: External: 1 steps; on right going up ?Has following equipment at home: None ? ?OCCUPATION: retired ? ?PLOF: Independent ? ?PATIENT GOALS She hopes to eliminate her pain and be able to continue being active.  ? ? ?OBJECTIVE:  ? ?DIAGNOSTIC FINDINGS:  ?None of lumbar spine ? ?PATIENT SURVEYS:  ?FOTO 44 (goal is 15) ? ?SCREENING FOR RED FLAGS: ?Bowel or bladder incontinence: No ?Spinal tumors: No ?Cauda equina syndrome: No ?Compression fracture: No ?Abdominal aneurysm: No ? ?COGNITION: ? Overall cognitive status: Within functional limits for tasks assessed   ?  ?SENSATION: ?WFL ? ?MUSCLE LENGTH: ?Hamstrings: Right 60 deg; Left 50 deg ?Thomas test: Mild positive bilaterally ? ?POSTURE:  ?WNL ? ?LUMBAR ROM:  ? ?Active  A/PROM  ?10/23/2021  ?Flexion WNL  ?Extension WNL  ?Right lateral flexion WNL  ?Left lateral flexion WNL  ?Right rotation WNL  ?Left rotation  WNL  ? (Blank rows = not tested) ? ?LE ROM: ? ?WNL bilaterally ? ?LE MMT: ? ?Generally 4 to 4+/5 with exception of hip abduction, hip extension and hip ER which were all 4-/5 bilaterally ? ?LUMBAR SPECIAL TESTS:  ?Straight leg raise test: Negative ? ? ? ?GAIT: ?Distance walked: 30 ?Assistive device utilized: None ?Level of assistance: Complete Independence ?Comments: Slightly antalgic but good heel to toe progression ? ? ? ?TODAY'S TREATMENT  ?Initiated HEP and completed initial eval ? ? ?PATIENT EDUCATION:  ?Education details: Initiated HEP ?Person educated: Patient ?Education method: Explanation ?Education comprehension: verbalized understanding, returned demonstration, and verbal cues required ? ? ?HOME EXERCISE PROGRAM: ?Access Code: [email or web address] ?URL: [email or web address] ?Date: 10/23/2021 ?Prepared by: Candyce Churn ? ?Program Notes ?All stretches hold x 30 seconds and do both sides ? ?Exercises ?- Standing Hamstring Stretch on Chair  - 1 x daily - 7 x weekly - 1 sets - 3 reps ?- Press photographer with Table and Chair Support  - 1 x daily - 7 x weekly - 3 sets - 10 reps ?- Seated Piriformis Stretch with Trunk Bend  - 1 x daily - 7 x weekly - 3 sets - 10 reps ?- Supine Figure 4 Piriformis Stretch  - 1 x daily - 7 x weekly - 3 sets - 10 reps ?- Supine ITB Stretch with Strap  - 1 x daily - 7 x weekly - 3 sets - 10 reps ?- Supine Hamstring Stretch with Strap  - 1 x daily - 7 x weekly - 3 sets - 10 reps ? ?ASSESSMENT: ? ?CLINICAL IMPRESSION: ?Patient is a 76 y.o. female who was seen today for physical therapy evaluation and treatment for Low back and hip pain R > L.  She is fairly active and plays golf once per week.  She presents with mild compensatory scoliosis and decreased lumbar lordosis, decreased LE flexibility and poor core strength.  She would benefit from skilled PT for LE flexibility, core strength and LE strengthening.   ? ? ?OBJECTIVE IMPAIRMENTS decreased mobility, difficulty walking,  decreased strength,  increased fascial restrictions, increased muscle spasms, impaired flexibility, postural dysfunction, and pain.  ? ?ACTIVITY LIMITATIONS cleaning, community activity, driving, meal prep, occupation, laundry, yard work, and shopping.  ? ?PERSONAL FACTORS Age, Fitness, Past/current experiences, and Time since onset of injury/illness/exacerbation are also affecting patient's functional outcome.  ? ? ?REHAB POTENTIAL: Good ? ?CLINICAL DECISION MAKING: Stable/uncomplicated ? ?EVALUATION COMPLEXITY: Low ? ? ?GOALS: ?Goals reviewed with patient? Yes ? ?SHORT TERM GOALS: Target date: 11/20/2021    (Remove Blue Hyperlink) ? ?Independence with initial HEP  ?Baseline: ?Goal status: INITIAL ? ?2.  Pain report to be no greater than 4/10  ?Baseline:  ?Goal status: INITIAL ? ? ?LONG TERM GOALS: Target date: 12/18/2021  (Remove Blue Hyperlink) ? ?Patient to be independent with advanced HEP  ?Baseline:  ?Goal status: INITIAL ? ?2.  Patient to be independent with advanced HEP  ?Baseline:  ?Goal status: INITIAL ? ?3.  Patient to be able to sleep through the night. ?Baseline:  ?Goal status: INITIAL ? ?4.  Patient to be able to resume golfing. ?Baseline:  ?Goal status: INITIAL ? ? ?PLAN: ?PT FREQUENCY: 2x/week ? ?PT DURATION: 8 weeks ? ?PLANNED INTERVENTIONS: Therapeutic exercises, Therapeutic activity, Neuromuscular re-education, Balance training, Gait training, Patient/Family education, Joint mobilization, Stair training, Aquatic Therapy, Dry Needling, Electrical stimulation, Spinal mobilization, Cryotherapy, Moist heat, Taping, Traction, Ultrasound, and Manual therapy. ? ?PLAN FOR NEXT SESSION: Review HEP, NuStep, initiate core strengthening and hip strengthening.  ? ? ?Anderson Malta B. Kieren Ricci, PT ?10/23/21 3:48 PM  ?

## 2021-10-25 ENCOUNTER — Ambulatory Visit: Payer: Medicare Other

## 2021-10-25 DIAGNOSIS — M5459 Other low back pain: Secondary | ICD-10-CM

## 2021-10-25 DIAGNOSIS — R262 Difficulty in walking, not elsewhere classified: Secondary | ICD-10-CM

## 2021-10-25 DIAGNOSIS — M545 Low back pain, unspecified: Secondary | ICD-10-CM | POA: Diagnosis not present

## 2021-10-25 DIAGNOSIS — M6281 Muscle weakness (generalized): Secondary | ICD-10-CM

## 2021-10-25 DIAGNOSIS — M25551 Pain in right hip: Secondary | ICD-10-CM

## 2021-10-25 NOTE — Therapy (Signed)
?OUTPATIENT PHYSICAL THERAPY TREATMENT NOTE ? ? ?Patient Name: Abigail Pruitt ?MRN: [id number] ?DOB:1945/12/01, 76 y.o., female ?Today's Date: 10/25/2021 ? ?PCP: Fernanda Drum, MD   ?REFERRING PROVIDER: Christena Deem, MD ? ?END OF SESSION:  ? PT End of Session - 10/25/21 1024   ? ? Visit Number 2   ? Date for PT Re-Evaluation 12/18/21   ? Authorization Type Medicare A and B   ? Progress Note Due on Visit 10   ? PT Start Time 1018   ? PT Stop Time 1100   ? PT Time Calculation (min) 42 min   ? Activity Tolerance Patient tolerated treatment well   ? Behavior During Therapy [facility] for tasks assessed/performed   ? ?  ?  ? ?  ? ? ?Past Medical History:  ?Diagnosis Date  ? Colon polyps   ? Family hx of colon cancer   ? Hot flashes   ? Severe hot flashes  ? Hyperlipidemia   ? Osteoarthritis   ? ?History reviewed. No pertinent surgical history. ?Patient Active Problem List  ? Diagnosis Date Noted  ? Other low back pain 10/23/2021  ? Pain in right hip 10/23/2021  ? ? ?REFERRING DIAG: M54.50 (ICD-10-CM) - Low back pain, unspecified  ? ?THERAPY DIAG:  ?Other low back pain ? ?Pain in right hip ? ?Muscle weakness (generalized) ? ?Difficulty in walking, not elsewhere classified ? ?PERTINENT HISTORY: see above ? ?PRECAUTIONS: none ? ?SUBJECTIVE: Patient states she did well with her exercises.  Pain 2/10.  Wants to review to be sure she is doing these exercises correctly.  ? ?PAIN:  ?Are you having pain? Yes: NPRS scale: 2/10 ?Pain location: left side hip/low back ?Pain description: discomfort ?Aggravating factors: nothing in particular ?Relieving factors: stretches, meds ? ? ?OBJECTIVE: (objective measures completed at initial evaluation unless otherwise dated) ? ?OBJECTIVE:  ?  ?DIAGNOSTIC FINDINGS:  ?None of lumbar spine ?  ?PATIENT SURVEYS:  ?FOTO 56 (goal is 78) ?  ?SCREENING FOR RED FLAGS: ?Bowel or bladder incontinence: No ?Spinal tumors: No ?Cauda equina syndrome: No ?Compression fracture: No ?Abdominal aneurysm: No ?   ?COGNITION: ?          Overall cognitive status: Within functional limits for tasks assessed               ?           ?SENSATION: ?WFL ?  ?MUSCLE LENGTH: ?Hamstrings: Right 60 deg; Left 50 deg ?Thomas test: Mild positive bilaterally ?  ?POSTURE:  ?WNL ?  ?LUMBAR ROM:  ?  ?Active  A/PROM  ?10/23/2021  ?Flexion WNL  ?Extension WNL  ?Right lateral flexion WNL  ?Left lateral flexion WNL  ?Right rotation WNL  ?Left rotation WNL  ? (Blank rows = not tested) ?  ?LE ROM: ?  ?WNL bilaterally ?  ?LE MMT: ?  ?Generally 4 to 4+/5 with exception of hip abduction, hip extension and hip ER which were all 4-/5 bilaterally ?  ?LUMBAR SPECIAL TESTS:  ?Straight leg raise test: Negative ?  ?  ?  ?GAIT: ?Distance walked: 30 ?Assistive device utilized: None ?Level of assistance: Complete Independence ?Comments: Slightly antalgic but good heel to toe progression ?  ?  ?  ?TODAY'S TREATMENT  10/25/21 ?NuStep x 5 min level 6 ?Reviewed HEP:  ?Supine hamstring stretch 3 x 30 sec each LE ?Supine IT band stretch 3 x 30 sec each LE ?Supine PPT x 20  ?Supine PPT with 90 degree heel taps x 20 ?Supine PPT  with dying bug x 20 ?Seated piriformis stretch 3 x 30 sec each side ?Standing hamstring stretch 3 x 30 sec ?Standing quad/hip flexor stretch 3 x 30 sec ? ?TODAY'S TREATMENT  10/23/21 ?Initiated HEP and completed initial eval ?  ?  ?PATIENT EDUCATION:  ?Education details: Initiated HEP ?Person educated: Patient ?Education method: Explanation ?Education comprehension: verbalized understanding, returned demonstration, and verbal cues required ?  ?  ?HOME EXERCISE PROGRAM: ?Access Code: [email or web address] ?URL: [email or web address] ?Date: 10/25/2021 ?Prepared by: Mikey KirschnerJennifer Kyrah Schiro ? ?Program Notes ?All stretches hold x 30 seconds and do both sides ? ?Exercises ?- Standing Hamstring Stretch on Chair  - 1 x daily - 7 x weekly - 1 sets - 3 reps ?- Materials engineertanding Quad Stretch with Table and Chair Support  - 1 x daily - 7 x weekly - 3 sets - 10 reps ?- Seated  Piriformis Stretch with Trunk Bend  - 1 x daily - 7 x weekly - 3 sets - 10 reps ?- Supine Figure 4 Piriformis Stretch  - 1 x daily - 7 x weekly - 3 sets - 10 reps ?- Supine ITB Stretch with Strap  - 1 x daily - 7 x weekly - 3 sets - 10 reps ?- Supine Hamstring Stretch with Strap  - 1 x daily - 7 x weekly - 3 sets - 10 reps ?- Supine Posterior Pelvic Tilt  - 1 x daily - 7 x weekly - 1 sets - 20 reps ?- Supine 90/90 Alternating Heel Touches with Posterior Pelvic Tilt  - 1 x daily - 7 x weekly - 3 sets - 10 reps ?- Supine Dead Bug with Leg Extension  - 1 x daily - 7 x weekly - 3 sets - 10 reps ?  ?ASSESSMENT: ?  ?CLINICAL IMPRESSION: ?Patient was able to demonstrate good technique on HEP with min verbal cues indicating good compliance with HEP.   She was able to tolerate addition of core strength with only minimal difficulty maintaining pelvic tilt.   She would benefit from skilled PT for LE flexibility, core strength and LE strengthening.   ?  ?  ?OBJECTIVE IMPAIRMENTS decreased mobility, difficulty walking, decreased strength, increased fascial restrictions, increased muscle spasms, impaired flexibility, postural dysfunction, and pain.  ?  ?ACTIVITY LIMITATIONS cleaning, community activity, driving, meal prep, occupation, laundry, yard work, and shopping.  ?  ?PERSONAL FACTORS Age, Fitness, Past/current experiences, and Time since onset of injury/illness/exacerbation are also affecting patient's functional outcome.  ?  ?  ?REHAB POTENTIAL: Good ?  ?CLINICAL DECISION MAKING: Stable/uncomplicated ?  ?EVALUATION COMPLEXITY: Low ?  ?  ?GOALS: ?Goals reviewed with patient? Yes ?  ?SHORT TERM GOALS: Target date: 11/20/2021    (Remove Blue Hyperlink) ?  ?Independence with initial HEP  ?Baseline: ?Goal status: INITIAL ?  ?2.  Pain report to be no greater than 4/10  ?Baseline:  ?Goal status: INITIAL ?  ?  ?LONG TERM GOALS: Target date: 12/18/2021  (Remove Blue Hyperlink) ?  ?Patient to be independent with advanced HEP   ?Baseline:  ?Goal status: INITIAL ?  ?2.  Patient to be independent with advanced HEP  ?Baseline:  ?Goal status: INITIAL ?  ?3.  Patient to be able to sleep through the night. ?Baseline:  ?Goal status: INITIAL ?  ?4.  Patient to be able to resume golfing. ?Baseline:  ?Goal status: INITIAL ?  ?  ?PLAN: ?PT FREQUENCY: 2x/week ?  ?PT DURATION: 8 weeks ?  ?PLANNED INTERVENTIONS: Therapeutic exercises, Therapeutic activity, Neuromuscular re-education, Balance training,  Gait training, Patient/Family education, Joint mobilization, Stair training, Aquatic Therapy, Dry Needling, Electrical stimulation, Spinal mobilization, Cryotherapy, Moist heat, Taping, Traction, Ultrasound, and Manual therapy. ?  ?PLAN FOR NEXT SESSION: Review HEP, NuStep, initiate core strengthening and hip strengthening.  ? ? ? ? ?Vernell Barrier, PT ?10/25/2021, 11:00 AM ? ?   ?

## 2021-10-31 ENCOUNTER — Encounter: Payer: PRIVATE HEALTH INSURANCE | Admitting: Physical Therapy

## 2021-11-03 ENCOUNTER — Ambulatory Visit: Payer: Medicare Other | Admitting: Physical Therapy

## 2021-11-03 DIAGNOSIS — M6281 Muscle weakness (generalized): Secondary | ICD-10-CM

## 2021-11-03 DIAGNOSIS — M5459 Other low back pain: Secondary | ICD-10-CM

## 2021-11-03 DIAGNOSIS — M545 Low back pain, unspecified: Secondary | ICD-10-CM | POA: Diagnosis not present

## 2021-11-03 DIAGNOSIS — M25551 Pain in right hip: Secondary | ICD-10-CM

## 2021-11-03 NOTE — Patient Instructions (Signed)

## 2021-11-03 NOTE — Therapy (Signed)
OUTPATIENT PHYSICAL THERAPY TREATMENT NOTE   Patient Name: Abigail Pruitt MRN: [id number] DOB:12/10/1945, 76 y.o., female Today's Date: 11/03/2021  PCP: Fernanda Drum, MD   REFERRING PROVIDER: Christena Deem, MD  END OF SESSION:   PT End of Session - 11/03/21 0803     Visit Number 3    Date for PT Re-Evaluation 12/18/21    Authorization Type Medicare A and B    Progress Note Due on Visit 10    PT Start Time 0801    PT Stop Time 0840    PT Time Calculation (min) 39 min    Activity Tolerance Patient tolerated treatment well             Past Medical History:  Diagnosis Date   Colon polyps    Family hx of colon cancer    Hot flashes    Severe hot flashes   Hyperlipidemia    Osteoarthritis    No past surgical history on file. Patient Active Problem List   Diagnosis Date Noted   Other low back pain 10/23/2021   Pain in right hip 10/23/2021    REFERRING DIAG: M54.50 (ICD-10-CM) - Low back pain, unspecified   THERAPY DIAG:  Other low back pain  Pain in right hip  Muscle weakness (generalized)  PERTINENT HISTORY: see above  PRECAUTIONS: none  SUBJECTIVE: [place] and had [place] pain.  Yesterday had posterior/lateral hip pain.  The one exercise bothers my shoulder.  The other ex's feel good on my back.   I feel like no matter how I stretch I can't get that to release.    PAIN:  Are you having pain? Yes: NPRS scale: 2/10 Pain location: left side hip/low back Pain description: discomfort Aggravating factors: nothing in particular Relieving factors: stretches, meds   OBJECTIVE: (objective measures completed at initial evaluation unless otherwise dated)  OBJECTIVE:    DIAGNOSTIC FINDINGS:  None of lumbar spine   PATIENT SURVEYS:  FOTO 61 (goal is 55)   SCREENING FOR RED FLAGS: Bowel or bladder incontinence: No Spinal tumors: No Cauda equina syndrome: No Compression fracture: No Abdominal aneurysm: No   COGNITION:            Overall cognitive status: Within functional limits for tasks assessed                          SENSATION: WFL   MUSCLE LENGTH: Hamstrings: Right 60 deg; Left 50 deg Thomas test: Mild positive bilaterally   POSTURE:  WNL   LUMBAR ROM:    Active  A/PROM  10/23/2021  Flexion WNL  Extension WNL  Right lateral flexion WNL  Left lateral flexion WNL  Right rotation WNL  Left rotation WNL   (Blank rows = not tested)   LE ROM:   WNL bilaterally   LE MMT:   Generally 4 to 4+/5 with exception of hip abduction, hip extension and hip ER which were all 4-/5 bilaterally   LUMBAR SPECIAL TESTS:  Straight leg raise test: Negative       GAIT: Distance walked: 30 Assistive device utilized: None Level of assistance: Complete Independence Comments: Slightly antalgic but good heel to toe progression   TODAY'S TREATMENT  11/03/21 NuStep x 5 min level 6 Reviewed HEP:  Supine piriformis stretch 2x 30 sec Modified to hand to knee push instead of dead bug secondary to shoulder pain Supine bent knee raises 8x Supine green band clams 10x too easy  Green band sidelying clams 10x right/left  Manual therapy soft tissue mob to gluteals: right long axis distraction, inferior and AP in internal rotation grade 3 3x 20 sec each Neutral gapping lumbar spine 3x 20 sec   Trigger Point Dry-Needling  Treatment instructions: Expect mild to moderate muscle soreness. S/S of pneumothorax if dry needled over a lung field, and to seek immediate medical attention should they occur. Patient verbalized understanding of these instructions and education.  Patient Consent Given: Yes Education handout provided: Yes Muscles treated: right gluteals and piriformis  Treatment response/outcome: improved soft tissue length    TODAY'S TREATMENT  10/25/21 NuStep x 5 min level 6 Reviewed HEP:  Supine hamstring stretch 3 x 30 sec each LE Supine IT band stretch 3 x 30 sec each LE Supine PPT x 20  Supine PPT with  90 degree heel taps x 20 Supine PPT with dying bug x 20 Seated piriformis stretch 3 x 30 sec each side Standing hamstring stretch 3 x 30 sec Standing quad/hip flexor stretch 3 x 30 sec  TODAY'S TREATMENT  10/23/21 Initiated HEP and completed initial eval     PATIENT EDUCATION:  Education details: Initiated HEP Person educated: Patient Education method: Explanation Education comprehension: verbalized understanding, returned demonstration, and verbal cues required     HOME EXERCISE PROGRAM:  Access Code: [email or web address] URL: [email or web address] Date: 11/03/2021 Prepared by: Lavinia Sharps  Program Notes All stretches hold x 30 seconds and do both sides  Exercises - Standing Hamstring Stretch on Chair  - 1 x daily - 7 x weekly - 1 sets - 3 reps - Standing Quad Stretch with Table and Chair Support  - 1 x daily - 7 x weekly - 3 sets - 10 reps - Seated Piriformis Stretch with Trunk Bend  - 1 x daily - 7 x weekly - 3 sets - 10 reps - Supine Figure 4 Piriformis Stretch  - 1 x daily - 7 x weekly - 3 sets - 10 reps - Supine ITB Stretch with Strap  - 1 x daily - 7 x weekly - 3 sets - 10 reps - Supine Hamstring Stretch with Strap  - 1 x daily - 7 x weekly - 3 sets - 10 reps - Supine Posterior Pelvic Tilt  - 1 x daily - 7 x weekly - 1 sets - 20 reps - Supine 90/90 Alternating Heel Touches with Posterior Pelvic Tilt  - 1 x daily - 7 x weekly - 3 sets - 10 reps - Hooklying Isometric Hip Flexion  - 1 x daily - 7 x weekly - 1 sets - 10 reps - Clam with Resistance  - 1 x daily - 7 x weekly - 1 sets - 10 reps   ASSESSMENT:   CLINICAL IMPRESSION:  Able to demo HEP with min verbal cues.  Good initial response to DN and hip mobilization.   Patient states at the end of session, "I already feel better." Therapist monitoring response to all interventions and modifying treatment accordingly.     OBJECTIVE IMPAIRMENTS decreased mobility, difficulty walking, decreased strength, increased  fascial restrictions, increased muscle spasms, impaired flexibility, postural dysfunction, and pain.    ACTIVITY LIMITATIONS cleaning, community activity, driving, meal prep, occupation, laundry, yard work, and shopping.    PERSONAL FACTORS Age, Fitness, Past/current experiences, and Time since onset of injury/illness/exacerbation are also affecting patient's functional outcome.      REHAB POTENTIAL: Good   CLINICAL DECISION MAKING: Stable/uncomplicated   EVALUATION COMPLEXITY: Low  GOALS: Goals reviewed with patient? Yes   SHORT TERM GOALS: Target date: 11/20/2021    (Remove Blue Hyperlink)   Independence with initial HEP  Baseline: Goal status: INITIAL   2.  Pain report to be no greater than 4/10  Baseline:  Goal status: INITIAL     LONG TERM GOALS: Target date: 12/18/2021  (Remove Blue Hyperlink)   Patient to be independent with advanced HEP  Baseline:  Goal status: INITIAL   2.  Patient to be independent with advanced HEP  Baseline:  Goal status: INITIAL   3.  Patient to be able to sleep through the night. Baseline:  Goal status: INITIAL   4.  Patient to be able to resume golfing. Baseline:  Goal status: INITIAL     PLAN: PT FREQUENCY: 2x/week   PT DURATION: 8 weeks   PLANNED INTERVENTIONS: Therapeutic exercises, Therapeutic activity, Neuromuscular re-education, Balance training, Gait training, Patient/Family education, Joint mobilization, Stair training, Aquatic Therapy, Dry Needling, Electrical stimulation, Spinal mobilization, Cryotherapy, Moist heat, Taping, Traction, Ultrasound, and Manual therapy.   PLAN FOR NEXT SESSION: assess response to DN#1;  may add DN to lumbar region if she feels it was helpful;  Review HEP, NuStep, core strengthening and hip strengthening (limited UE secondary to shoulder pain)  Lavinia SharpsStacy Winfred Iiams, PT 11/03/21 12:14 PM Phone: [phone] Fax: [phone]

## 2021-11-07 ENCOUNTER — Ambulatory Visit: Payer: Medicare Other

## 2021-11-07 DIAGNOSIS — M25551 Pain in right hip: Secondary | ICD-10-CM

## 2021-11-07 DIAGNOSIS — M6281 Muscle weakness (generalized): Secondary | ICD-10-CM

## 2021-11-07 DIAGNOSIS — M545 Low back pain, unspecified: Secondary | ICD-10-CM | POA: Diagnosis not present

## 2021-11-07 DIAGNOSIS — R262 Difficulty in walking, not elsewhere classified: Secondary | ICD-10-CM

## 2021-11-07 DIAGNOSIS — M5459 Other low back pain: Secondary | ICD-10-CM

## 2021-11-07 NOTE — Therapy (Signed)
OUTPATIENT PHYSICAL THERAPY TREATMENT NOTE   Patient Name: Abigail Pruitt MRN: [id number] DOB:07-Jul-1945, 76 y.o., female Today's Date: 11/07/2021  PCP: Gerald Stabs, MD   REFERRING PROVIDER: Inez Catalina, MD  END OF SESSION:   PT End of Session - 11/07/21 0759     Visit Number 4    Date for PT Re-Evaluation 12/18/21    Authorization Type Medicare A and B    Progress Note Due on Visit 10    PT Start Time 0800    PT Stop Time 0845    PT Time Calculation (min) 45 min    Activity Tolerance Patient tolerated treatment well    Behavior During Therapy WFL for tasks assessed/performed             Past Medical History:  Diagnosis Date   Colon polyps    Family hx of colon cancer    Hot flashes    Severe hot flashes   Hyperlipidemia    Osteoarthritis    History reviewed. No pertinent surgical history. Patient Active Problem List   Diagnosis Date Noted   Other low back pain 10/23/2021   Pain in right hip 10/23/2021    REFERRING DIAG: M54.50 (ICD-10-CM) - Low back pain, unspecified   THERAPY DIAG:  Other low back pain  Pain in right hip  Muscle weakness (generalized)  Difficulty in walking, not elsewhere classified  PERTINENT HISTORY: see above  PRECAUTIONS: none  SUBJECTIVE: Patient states she responded very well to dry needling. But over the weekend, it locked up on me.     PAIN:  Are you having pain? Yes: NPRS scale: 6/10 Pain location: left side hip/low back Pain description: discomfort Aggravating factors: nothing in particular Relieving factors: stretches, meds   OBJECTIVE: (objective measures completed at initial evaluation unless otherwise dated)  OBJECTIVE:    DIAGNOSTIC FINDINGS:  None of lumbar spine   PATIENT SURVEYS:  FOTO 61 (goal is 70)   SCREENING FOR RED FLAGS: Bowel or bladder incontinence: No Spinal tumors: No Cauda equina syndrome: No Compression fracture: No Abdominal aneurysm: No   COGNITION:            Overall cognitive status: Within functional limits for tasks assessed                          SENSATION: WFL   MUSCLE LENGTH: Hamstrings: Right 60 deg; Left 50 deg Thomas test: Mild positive bilaterally   POSTURE:  WNL   LUMBAR ROM:    Active  A/PROM  10/23/2021  Flexion WNL  Extension WNL  Right lateral flexion WNL  Left lateral flexion WNL  Right rotation WNL  Left rotation WNL   (Blank rows = not tested)   LE ROM:   WNL bilaterally   LE MMT:   Generally 4 to 4+/5 with exception of hip abduction, hip extension and hip ER which were all 4-/5 bilaterally   LUMBAR SPECIAL TESTS:  Straight leg raise test: Negative       GAIT: Distance walked: 30 Assistive device utilized: None Level of assistance: Complete Independence Comments: Slightly antalgic but good heel to toe progression   TODAY'S TREATMENT  11/07/21 NuStep x 5 min level 5 Supine hamstring streth 2 x 20 sed Supine piriformis stretch 2x 30 sec Supine IT band stretch 2 x 30 sec PPT x 20  PPT with dying bug with physio ball (patient unable to do due to coordination) PPT with dying bug (modified due to  right shoulder pain) x 20 Prone hip flexor stretch bilateral x 5 each hold 10 sec Ice to right shoulder and low back x 10 min  TODAY'S TREATMENT  11/03/21 NuStep x 5 min level 6 Reviewed HEP:  Supine piriformis stretch 2x 30 sec Modified to hand to knee push instead of dead bug secondary to shoulder pain Supine bent knee raises 8x Supine green band clams 10x too easy Green band sidelying clams 10x right/left  Manual therapy soft tissue mob to gluteals: right long axis distraction, inferior and AP in internal rotation grade 3 3x 20 sec each Neutral gapping lumbar spine 3x 20 sec   Trigger Point Dry-Needling  Treatment instructions: Expect mild to moderate muscle soreness. S/S of pneumothorax if dry needled over a lung field, and to seek immediate medical attention should they occur. Patient verbalized  understanding of these instructions and education.  Patient Consent Given: Yes Education handout provided: Yes Muscles treated: right gluteals and piriformis  Treatment response/outcome: improved soft tissue length    TODAY'S TREATMENT  10/25/21 NuStep x 5 min level 6 Reviewed HEP:  Supine hamstring stretch 3 x 30 sec each LE Supine IT band stretch 3 x 30 sec each LE Supine PPT x 20  Supine PPT with 90 degree heel taps x 20 Supine PPT with dying bug x 20 Seated piriformis stretch 3 x 30 sec each side Standing hamstring stretch 3 x 30 sec Standing quad/hip flexor stretch 3 x 30 sec    PATIENT EDUCATION:  Education details: Educated patient on avoiding aggressive stretching and how this could cause increased inflammation.  Person educated: Patient Education method: Explanation Education comprehension: verbalized understanding, returned demonstration, and verbal cues required     HOME EXERCISE PROGRAM:  Access Code: [email or web address] URL: [email or web address] Date: 11/03/2021 Prepared by: Ruben Im  Program Notes All stretches hold x 30 seconds and do both sides  Exercises - Standing Hamstring Stretch on Chair  - 1 x daily - 7 x weekly - 1 sets - 3 reps - Standing Quad Stretch with Table and Chair Support  - 1 x daily - 7 x weekly - 3 sets - 10 reps - Seated Piriformis Stretch with Trunk Bend  - 1 x daily - 7 x weekly - 3 sets - 10 reps - Supine Figure 4 Piriformis Stretch  - 1 x daily - 7 x weekly - 3 sets - 10 reps - Supine ITB Stretch with Strap  - 1 x daily - 7 x weekly - 3 sets - 10 reps - Supine Hamstring Stretch with Strap  - 1 x daily - 7 x weekly - 3 sets - 10 reps - Supine Posterior Pelvic Tilt  - 1 x daily - 7 x weekly - 1 sets - 20 reps - Supine 90/90 Alternating Heel Touches with Posterior Pelvic Tilt  - 1 x daily - 7 x weekly - 3 sets - 10 reps - Hooklying Isometric Hip Flexion  - 1 x daily - 7 x weekly - 1 sets - 10 reps - Clam with Resistance  - 1  x daily - 7 x weekly - 1 sets - 10 reps   ASSESSMENT:   CLINICAL IMPRESSION:  Patient was able to complete all tasks but continues to experience right leg pain that appears to be radicular in nature.  She will bring MRI as these are not available in Epic.  We suggested ice vs heat for pain control and educated on avoiding being overly aggressive with stretches which  could be irritating the nerve root.     OBJECTIVE IMPAIRMENTS decreased mobility, difficulty walking, decreased strength, increased fascial restrictions, increased muscle spasms, impaired flexibility, postural dysfunction, and pain.    ACTIVITY LIMITATIONS cleaning, community activity, driving, meal prep, occupation, laundry, yard work, and shopping.    PERSONAL FACTORS Age, Fitness, Past/current experiences, and Time since onset of injury/illness/exacerbation are also affecting patient's functional outcome.      REHAB POTENTIAL: Good   CLINICAL DECISION MAKING: Stable/uncomplicated   EVALUATION COMPLEXITY: Low     GOALS: Goals reviewed with patient? Yes   SHORT TERM GOALS: Target date: 11/20/2021    (Remove Blue Hyperlink)   Independence with initial HEP  Baseline: Goal status: INITIAL   2.  Pain report to be no greater than 4/10  Baseline:  Goal status: INITIAL     LONG TERM GOALS: Target date: 12/18/2021  (Remove Blue Hyperlink)   Patient to be independent with advanced HEP  Baseline:  Goal status: INITIAL   2.  Patient to be independent with advanced HEP  Baseline:  Goal status: INITIAL   3.  Patient to be able to sleep through the night. Baseline:  Goal status: INITIAL   4.  Patient to be able to resume golfing. Baseline:  Goal status: INITIAL     PLAN: PT FREQUENCY: 2x/week   PT DURATION: 8 weeks   PLANNED INTERVENTIONS: Therapeutic exercises, Therapeutic activity, Neuromuscular re-education, Balance training, Gait training, Patient/Family education, Joint mobilization, Stair training, Aquatic  Therapy, Dry Needling, Electrical stimulation, Spinal mobilization, Cryotherapy, Moist heat, Taping, Traction, Ultrasound, and Manual therapy.   PLAN FOR NEXT SESSION: Patient would like to try DN again.  Add DN to lumbar region;  Review HEP, NuStep, core strengthening and hip strengthening (limited UE secondary to shoulder pain)  Anderson Malta B. Jabaree Mercado, PT 11/07/21 8:54 AM  Phone: [phone] Fax: [phone]

## 2021-11-09 ENCOUNTER — Ambulatory Visit: Payer: Medicare Other | Attending: Sports Medicine | Admitting: Physical Therapy

## 2021-11-09 DIAGNOSIS — M5459 Other low back pain: Secondary | ICD-10-CM | POA: Insufficient documentation

## 2021-11-09 DIAGNOSIS — R262 Difficulty in walking, not elsewhere classified: Secondary | ICD-10-CM | POA: Diagnosis present

## 2021-11-09 DIAGNOSIS — M25512 Pain in left shoulder: Secondary | ICD-10-CM | POA: Diagnosis present

## 2021-11-09 DIAGNOSIS — M6281 Muscle weakness (generalized): Secondary | ICD-10-CM | POA: Diagnosis present

## 2021-11-09 DIAGNOSIS — M25511 Pain in right shoulder: Secondary | ICD-10-CM | POA: Insufficient documentation

## 2021-11-09 DIAGNOSIS — M25551 Pain in right hip: Secondary | ICD-10-CM | POA: Insufficient documentation

## 2021-11-09 DIAGNOSIS — G8929 Other chronic pain: Secondary | ICD-10-CM | POA: Insufficient documentation

## 2021-11-09 NOTE — Therapy (Signed)
OUTPATIENT PHYSICAL THERAPY TREATMENT NOTE   Patient Name: Abigail Pruitt MRN: [id number] DOB:[birth year], 76 y.o., female Today's Date: 11/09/2021  PCP: Gerald Stabs, MD   REFERRING PROVIDER: Inez Catalina, MD  END OF SESSION:   PT End of Session - 11/09/21 0930     Visit Number 5    Date for PT Re-Evaluation 12/18/21    Authorization Type Medicare A and B    Progress Note Due on Visit 10    PT Start Time 0930    PT Stop Time 1010    PT Time Calculation (min) 40 min    Activity Tolerance Patient tolerated treatment well             Past Medical History:  Diagnosis Date   Colon polyps    Family hx of colon cancer    Hot flashes    Severe hot flashes   Hyperlipidemia    Osteoarthritis    No past surgical history on file. Patient Active Problem List   Diagnosis Date Noted   Other low back pain 10/23/2021   Pain in right hip 10/23/2021    REFERRING DIAG: M54.50 (ICD-10-CM) - Low back pain, unspecified   THERAPY DIAG:  Other low back pain  Pain in right hip  Muscle weakness (generalized)  PERTINENT HISTORY: see above  PRECAUTIONS: none  SUBJECTIVE:  My right posterior thigh has really bothered me (hamstring area) especially walking up and incline.  Left > right LBP.  Right glute a little better.     PAIN:  Are you having pain? Yes: NPRS scale: 5/10 Pain location: left side hip/low back Pain description: discomfort Aggravating factors: nothing in particular Relieving factors: stretches, meds   OBJECTIVE: (objective measures completed at initial evaluation unless otherwise dated)  OBJECTIVE:    DIAGNOSTIC FINDINGS:  None of lumbar spine   PATIENT SURVEYS:  FOTO 61 (goal is 60)   SCREENING FOR RED FLAGS: Bowel or bladder incontinence: No Spinal tumors: No Cauda equina syndrome: No Compression fracture: No Abdominal aneurysm: No   COGNITION:           Overall cognitive status: Within functional limits for tasks assessed                           SENSATION: WFL   MUSCLE LENGTH: Hamstrings: Right 60 deg; Left 50 deg Thomas test: Mild positive bilaterally   POSTURE:  WNL   LUMBAR ROM:    Active  A/PROM  10/23/2021  Flexion WNL  Extension WNL  Right lateral flexion WNL  Left lateral flexion WNL  Right rotation WNL  Left rotation WNL   (Blank rows = not tested)   LE ROM:   WNL bilaterally   LE MMT:   Generally 4 to 4+/5 with exception of hip abduction, hip extension and hip ER which were all 4-/5 bilaterally   LUMBAR SPECIAL TESTS:  Straight leg raise test: Negative       GAIT: Distance walked: 30 Assistive device utilized: None Level of assistance: Complete Independence Comments: Slightly antalgic but good heel to toe progression  TODAY'S TREATMENT  6/1: Discussed hold on HS and ITB strap stretch fro 2 weeks Discussed short steps with uphill walking Discussed HEP  Manual therapy soft tissue mob to gluteals, hamstring, lumbar paraspinals Trigger Point Dry-Needling  Treatment instructions: Expect mild to moderate muscle soreness. S/S of pneumothorax if dry needled over a lung field, and to seek immediate medical attention should they occur.  Patient verbalized understanding of these instructions and education.  Patient Consent Given: Yes Education handout provided: Yes Muscles treated:bil multifidi,  right gluteals and piriformis; right HS ES added to DN to bil multifidi 1.5 ma 8 min   Treatment response/outcome: improved soft tissue length      TODAY'S TREATMENT  11/07/21 NuStep x 5 min level 5 Supine hamstring streth 2 x 20 sed Supine piriformis stretch 2x 30 sec Supine IT band stretch 2 x 30 sec PPT x 20  PPT with dying bug with physio ball (patient unable to do due to coordination) PPT with dying bug (modified due to right shoulder pain) x 20 Prone hip flexor stretch bilateral x 5 each hold 10 sec Ice to right shoulder and low back x 10 min  TODAY'S TREATMENT  11/03/21 NuStep x  5 min level 6 Reviewed HEP:  Supine piriformis stretch 2x 30 sec Modified to hand to knee push instead of dead bug secondary to shoulder pain Supine bent knee raises 8x Supine green band clams 10x too easy Green band sidelying clams 10x right/left  Manual therapy soft tissue mob to gluteals: right long axis distraction, inferior and AP in internal rotation grade 3 3x 20 sec each Neutral gapping lumbar spine 3x 20 sec   Trigger Point Dry-Needling  Treatment instructions: Expect mild to moderate muscle soreness. S/S of pneumothorax if dry needled over a lung field, and to seek immediate medical attention should they occur. Patient verbalized understanding of these instructions and education.  Patient Consent Given: Yes Education handout provided: Yes Muscles treated: right gluteals and piriformis  Treatment response/outcome: improved soft tissue length    TODAY'S TREATMENT  10/25/21 NuStep x 5 min level 6 Reviewed HEP:  Supine hamstring stretch 3 x 30 sec each LE Supine IT band stretch 3 x 30 sec each LE Supine PPT x 20  Supine PPT with 90 degree heel taps x 20 Supine PPT with dying bug x 20 Seated piriformis stretch 3 x 30 sec each side Standing hamstring stretch 3 x 30 sec Standing quad/hip flexor stretch 3 x 30 sec    PATIENT EDUCATION:  Education details: Educated patient on avoiding aggressive stretching and how this could cause increased inflammation.  Person educated: Patient Education method: Explanation Education comprehension: verbalized understanding, returned demonstration, and verbal cues required     HOME EXERCISE PROGRAM:  Access Code: [email or web address] URL: [email or web address] Date: 11/03/2021 Prepared by: Ruben Im  Program Notes All stretches hold x 30 seconds and do both sides  Exercises - Standing Hamstring Stretch on Chair  - 1 x daily - 7 x weekly - 1 sets - 3 reps - Standing Quad Stretch with Table and Chair Support  - 1 x daily - 7 x  weekly - 3 sets - 10 reps - Seated Piriformis Stretch with Trunk Bend  - 1 x daily - 7 x weekly - 3 sets - 10 reps - Supine Figure 4 Piriformis Stretch  - 1 x daily - 7 x weekly - 3 sets - 10 reps - Supine ITB Stretch with Strap  - 1 x daily - 7 x weekly - 3 sets - 10 reps - Supine Hamstring Stretch with Strap  - 1 x daily - 7 x weekly - 3 sets - 10 reps - Supine Posterior Pelvic Tilt  - 1 x daily - 7 x weekly - 1 sets - 20 reps - Supine 90/90 Alternating Heel Touches with Posterior Pelvic Tilt  - 1 x daily - 7  x weekly - 3 sets - 10 reps - Hooklying Isometric Hip Flexion  - 1 x daily - 7 x weekly - 1 sets - 10 reps - Clam with Resistance  - 1 x daily - 7 x weekly - 1 sets - 10 reps   ASSESSMENT:   CLINICAL IMPRESSION:  Discussed a hold on sciatic nerve tensioning with strap which may be contributing to posterior thigh symptom irritability.  Overall decreased tender points in gluteals and piriformis.  Good response to DN combined with ES targeting L5-S1 lumbar levels.     OBJECTIVE IMPAIRMENTS decreased mobility, difficulty walking, decreased strength, increased fascial restrictions, increased muscle spasms, impaired flexibility, postural dysfunction, and pain.    ACTIVITY LIMITATIONS cleaning, community activity, driving, meal prep, occupation, laundry, yard work, and shopping.    PERSONAL FACTORS Age, Fitness, Past/current experiences, and Time since onset of injury/illness/exacerbation are also affecting patient's functional outcome.      REHAB POTENTIAL: Good   CLINICAL DECISION MAKING: Stable/uncomplicated   EVALUATION COMPLEXITY: Low     GOALS: Goals reviewed with patient? Yes   SHORT TERM GOALS: Target date: 11/20/2021    (Remove Blue Hyperlink)   Independence with initial HEP  Baseline: Goal status: INITIAL   2.  Pain report to be no greater than 4/10  Baseline:  Goal status: INITIAL     LONG TERM GOALS: Target date: 12/18/2021  (Remove Blue Hyperlink)   Patient to  be independent with advanced HEP  Baseline:  Goal status: INITIAL   2.  Patient to be independent with advanced HEP  Baseline:  Goal status: INITIAL   3.  Patient to be able to sleep through the night. Baseline:  Goal status: INITIAL   4.  Patient to be able to resume golfing. Baseline:  Goal status: INITIAL     PLAN: PT FREQUENCY: 2x/week   PT DURATION: 8 weeks   PLANNED INTERVENTIONS: Therapeutic exercises, Therapeutic activity, Neuromuscular re-education, Balance training, Gait training, Patient/Family education, Joint mobilization, Stair training, Aquatic Therapy, Dry Needling, Electrical stimulation, Spinal mobilization, Cryotherapy, Moist heat, Taping, Traction, Ultrasound, and Manual therapy.   PLAN FOR NEXT SESSION: assess response to DNES to lumbar, gluteals and HS;  hold supine strap ex's for HS/sciatic nerve;  Review HEP, NuStep, core strengthening and hip strengthening (limited UE secondary to shoulder pain)  Ruben Im, PT 11/09/21 3:23 PM Phone: [phone] Fax: [phone]

## 2021-11-14 ENCOUNTER — Ambulatory Visit: Payer: Medicare Other | Admitting: Physical Therapy

## 2021-11-14 DIAGNOSIS — M5459 Other low back pain: Secondary | ICD-10-CM

## 2021-11-14 DIAGNOSIS — M25551 Pain in right hip: Secondary | ICD-10-CM

## 2021-11-14 DIAGNOSIS — M6281 Muscle weakness (generalized): Secondary | ICD-10-CM

## 2021-11-14 NOTE — Therapy (Signed)
OUTPATIENT PHYSICAL THERAPY TREATMENT NOTE   Patient Name: Abigail Pruitt MRN: [id number] DOB:[birth year], 76 y.o., female Today's Date: 11/14/2021  PCP: Fernanda DrumGonzalezrodriguez, Lirka, MD   REFERRING PROVIDER: Christena DeemAlexander, Kyle, MD  END OF SESSION:   PT End of Session - 11/14/21 1359     Visit Number 6    Date for PT Re-Evaluation 12/18/21    Authorization Type Medicare A and B    Progress Note Due on Visit 10    PT Start Time 1400    PT Stop Time 1440    PT Time Calculation (min) 40 min    Activity Tolerance Patient tolerated treatment well             Past Medical History:  Diagnosis Date   Colon polyps    Family hx of colon cancer    Hot flashes    Severe hot flashes   Hyperlipidemia    Osteoarthritis    No past surgical history on file. Patient Active Problem List   Diagnosis Date Noted   Other low back pain 10/23/2021   Pain in right hip 10/23/2021    REFERRING DIAG: M54.50 (ICD-10-CM) - Low back pain, unspecified   THERAPY DIAG:  Other low back pain  Pain in right hip  Muscle weakness (generalized)  PERTINENT HISTORY: see above  PRECAUTIONS: none  SUBJECTIVE:  I'm better!  I've had the most success with the DN.  Still some HS pain with walking.  It's about 40% better.  Saw Dr. Lyn HollingsheadAlexander.    PAIN:  Are you having pain? Yes: NPRS scale: 4/10 Pain location: left side hip/low back and right HS, gluteals Pain description: discomfort Aggravating factors: nothing in particular Relieving factors: stretches, meds   OBJECTIVE: (objective measures completed at initial evaluation unless otherwise dated)  OBJECTIVE:    DIAGNOSTIC FINDINGS:  None of lumbar spine   PATIENT SURVEYS:  FOTO 61 (goal is 6867)   SCREENING FOR RED FLAGS: Bowel or bladder incontinence: No Spinal tumors: No Cauda equina syndrome: No Compression fracture: No Abdominal aneurysm: No   COGNITION:           Overall cognitive status: Within functional limits for tasks assessed                           SENSATION: WFL   MUSCLE LENGTH: Hamstrings: Right 60 deg; Left 50 deg Thomas test: Mild positive bilaterally   POSTURE:  WNL   LUMBAR ROM:    Active  A/PROM  10/23/2021  Flexion WNL  Extension WNL  Right lateral flexion WNL  Left lateral flexion WNL  Right rotation WNL  Left rotation WNL   (Blank rows = not tested)   LE ROM:   WNL bilaterally   LE MMT:   Generally 4 to 4+/5 with exception of hip abduction, hip extension and hip ER which were all 4-/5 bilaterally   LUMBAR SPECIAL TESTS:  Straight leg raise test: Negative       GAIT: Distance walked: 30 Assistive device utilized: None Level of assistance: Complete Independence Comments: Slightly antalgic but good heel to toe progression   TODAY'S TREATMENT  6/6: Therapeutic ex to activate right glute medius with stand tall in kickstand, single leg standing and SLS with left touch back to forward/hip flexion 10x Manual therapy soft tissue mob to gluteals, hamstring, lumbar paraspinals Trigger Point Dry-Needling  Treatment instructions: Expect mild to moderate muscle soreness. S/S of pneumothorax if dry needled over a lung  field, and to seek immediate medical attention should they occur. Patient verbalized understanding of these instructions and education.  Patient Consent Given: Yes Education handout provided: Yes Muscles treated:bil multifidi,  right gluteals and piriformis; right HS ES added to DN to left multifidi  and right glute 1.5 ma 10 min   Treatment response/outcome: improved soft tissue length           6/1: Discussed hold on HS and ITB strap stretch fro 2 weeks Discussed short steps with uphill walking Discussed HEP  Manual therapy soft tissue mob to gluteals, hamstring, lumbar paraspinals Trigger Point Dry-Needling  Treatment instructions: Expect mild to moderate muscle soreness. S/S of pneumothorax if dry needled over a lung field, and to seek immediate medical attention  should they occur. Patient verbalized understanding of these instructions and education.  Patient Consent Given: Yes Education handout provided: Yes Muscles treated:bil multifidi,  right gluteals and piriformis; right HS ES added to DN to left multifidi and right HS 1.5 ma 10 min   Treatment response/outcome: improved soft tissue length      TODAY'S TREATMENT  11/07/21 NuStep x 5 min level 5 Supine hamstring streth 2 x 20 sed Supine piriformis stretch 2x 30 sec Supine IT band stretch 2 x 30 sec PPT x 20  PPT with dying bug with physio ball (patient unable to do due to coordination) PPT with dying bug (modified due to right shoulder pain) x 20 Prone hip flexor stretch bilateral x 5 each hold 10 sec Ice to right shoulder and low back x 10 min  TODAY'S TREATMENT  11/03/21 NuStep x 5 min level 6 Reviewed HEP:  Supine piriformis stretch 2x 30 sec Modified to hand to knee push instead of dead bug secondary to shoulder pain Supine bent knee raises 8x Supine green band clams 10x too easy Green band sidelying clams 10x right/left  Manual therapy soft tissue mob to gluteals: right long axis distraction, inferior and AP in internal rotation grade 3 3x 20 sec each Neutral gapping lumbar spine 3x 20 sec   Trigger Point Dry-Needling  Treatment instructions: Expect mild to moderate muscle soreness. S/S of pneumothorax if dry needled over a lung field, and to seek immediate medical attention should they occur. Patient verbalized understanding of these instructions and education.  Patient Consent Given: Yes Education handout provided: Yes Muscles treated: right gluteals and piriformis  Treatment response/outcome: improved soft tissue length       PATIENT EDUCATION:  Education details: Educated patient on avoiding aggressive stretching and how this could cause increased inflammation.  Person educated: Patient Education method: Explanation Education comprehension: verbalized  understanding, returned demonstration, and verbal cues required     HOME EXERCISE PROGRAM: Access Code: [email or web address] URL: [email or web address] Date: 11/14/2021 Prepared by: Lavinia Sharps  Program Notes All stretches hold x 30 seconds and do both sides  Exercises - Standing Hamstring Stretch on Chair  - 1 x daily - 7 x weekly - 1 sets - 3 reps - Standing Quad Stretch with Table and Chair Support  - 1 x daily - 7 x weekly - 3 sets - 10 reps - Seated Piriformis Stretch with Trunk Bend  - 1 x daily - 7 x weekly - 3 sets - 10 reps - Supine Figure 4 Piriformis Stretch  - 1 x daily - 7 x weekly - 3 sets - 10 reps - Supine ITB Stretch with Strap  - 1 x daily - 7 x weekly - 3 sets - 10 reps -  Supine Hamstring Stretch with Strap  - 1 x daily - 7 x weekly - 3 sets - 10 reps - Supine Posterior Pelvic Tilt  - 1 x daily - 7 x weekly - 1 sets - 20 reps - Supine 90/90 Alternating Heel Touches with Posterior Pelvic Tilt  - 1 x daily - 7 x weekly - 3 sets - 10 reps - Hooklying Isometric Hip Flexion  - 1 x daily - 7 x weekly - 1 sets - 10 reps - Clam with Resistance  - 1 x daily - 7 x weekly - 1 sets - 10 reps - Staggered Stance Gluteal Strengthening  - 1 x daily - 7 x weekly - 3 sets - 10 reps    ASSESSMENT:   CLINICAL IMPRESSION:  Good response to DN 3x.  We have added additional muscle groups to DN and added ES for optimal benefit.  Even though we have limited aggressive stretching to HS/sciatic nerve, we have discussed continued continuing glute med strengthening.   Patient states her back "already feels better" post session.  Therapist monitoring response to all interventions and modifying treatment accordingly.     OBJECTIVE IMPAIRMENTS decreased mobility, difficulty walking, decreased strength, increased fascial restrictions, increased muscle spasms, impaired flexibility, postural dysfunction, and pain.    ACTIVITY LIMITATIONS cleaning, community activity, driving, meal prep,  occupation, laundry, yard work, and shopping.    PERSONAL FACTORS Age, Fitness, Past/current experiences, and Time since onset of injury/illness/exacerbation are also affecting patient's functional outcome.      REHAB POTENTIAL: Good   CLINICAL DECISION MAKING: Stable/uncomplicated   EVALUATION COMPLEXITY: Low     GOALS: Goals reviewed with patient? Yes   SHORT TERM GOALS: Target date: 11/20/2021    (Remove Blue Hyperlink)   Independence with initial HEP  Baseline: Goal status: INITIAL   2.  Pain report to be no greater than 4/10  Baseline:  Goal status: INITIAL     LONG TERM GOALS: Target date: 12/18/2021  (Remove Blue Hyperlink)   Patient to be independent with advanced HEP  Baseline:  Goal status: INITIAL   2.  Patient to be independent with advanced HEP  Baseline:  Goal status: INITIAL   3.  Patient to be able to sleep through the night. Baseline:  Goal status: INITIAL   4.  Patient to be able to resume golfing. Baseline:  Goal status: INITIAL     PLAN: PT FREQUENCY: 2x/week   PT DURATION: 8 weeks   PLANNED INTERVENTIONS: Therapeutic exercises, Therapeutic activity, Neuromuscular re-education, Balance training, Gait training, Patient/Family education, Joint mobilization, Stair training, Aquatic Therapy, Dry Needling, Electrical stimulation, Spinal mobilization, Cryotherapy, Moist heat, Taping, Traction, Ultrasound, and Manual therapy.   PLAN FOR NEXT SESSION: check STGs next week;  assess response to DN/ES to lumbar, gluteals and HS;  hold supine strap ex's for HS/sciatic nerve secondary to high irritability from over stretching;  glute medius strengthening;  Review HEP, NuStep, core strengthening and hip strengthening (limited UE secondary to shoulder pain)  Lavinia Sharps, PT 11/14/21 2:48 PM Phone: [phone] Fax: [phone]

## 2021-11-16 ENCOUNTER — Ambulatory Visit: Payer: Medicare Other

## 2021-11-16 DIAGNOSIS — M6281 Muscle weakness (generalized): Secondary | ICD-10-CM

## 2021-11-16 DIAGNOSIS — M5459 Other low back pain: Secondary | ICD-10-CM | POA: Diagnosis not present

## 2021-11-16 DIAGNOSIS — M25551 Pain in right hip: Secondary | ICD-10-CM

## 2021-11-16 DIAGNOSIS — R262 Difficulty in walking, not elsewhere classified: Secondary | ICD-10-CM

## 2021-11-16 NOTE — Therapy (Signed)
OUTPATIENT PHYSICAL THERAPY TREATMENT NOTE   Patient Name: Abigail Pruitt MRN: [id number] DOB:1945/12/22, 76 y.o., female Today's Date: 11/16/2021  PCP: Gerald Stabs, MD   REFERRING PROVIDER: Inez Catalina, MD  END OF SESSION:   PT End of Session - 11/16/21 0933     Visit Number 7    Date for PT Re-Evaluation 12/18/21    Authorization Type Medicare A and B    Progress Note Due on Visit 10    PT Start Time 0933    PT Stop Time 1015    PT Time Calculation (min) 42 min    Activity Tolerance Patient tolerated treatment well    Behavior During Therapy WFL for tasks assessed/performed             Past Medical History:  Diagnosis Date   Colon polyps    Family hx of colon cancer    Hot flashes    Severe hot flashes   Hyperlipidemia    Osteoarthritis    History reviewed. No pertinent surgical history. Patient Active Problem List   Diagnosis Date Noted   Other low back pain 10/23/2021   Pain in right hip 10/23/2021    REFERRING DIAG: M54.50 (ICD-10-CM) - Low back pain, unspecified   THERAPY DIAG:  Other low back pain  Pain in right hip  Muscle weakness (generalized)  Difficulty in walking, not elsewhere classified  PERTINENT HISTORY: see above  PRECAUTIONS: none  SUBJECTIVE:  Patient states she did really well with the dry needling.  "I still have the leg pain but its not as bad but my back feels great"   PAIN:  Are you having pain? Yes: NPRS scale: 2/10 Pain location: left side hip/low back and right HS, gluteals Pain description: discomfort Aggravating factors: nothing in particular Relieving factors: stretches, meds   OBJECTIVE: (objective measures completed at initial evaluation unless otherwise dated)  OBJECTIVE:    DIAGNOSTIC FINDINGS:  None of lumbar spine   PATIENT SURVEYS:  FOTO 61 (goal is 52)   SCREENING FOR RED FLAGS: Bowel or bladder incontinence: No Spinal tumors: No Cauda equina syndrome: No Compression fracture:  No Abdominal aneurysm: No   COGNITION:           Overall cognitive status: Within functional limits for tasks assessed                          SENSATION: WFL   MUSCLE LENGTH: Hamstrings: Right 60 deg; Left 50 deg Thomas test: Mild positive bilaterally   POSTURE:  WNL   LUMBAR ROM:    Active  A/PROM  10/23/2021  Flexion WNL  Extension WNL  Right lateral flexion WNL  Left lateral flexion WNL  Right rotation WNL  Left rotation WNL   (Blank rows = not tested)   LE ROM:   WNL bilaterally   LE MMT:   Generally 4 to 4+/5 with exception of hip abduction, hip extension and hip ER which were all 4-/5 bilaterally   LUMBAR SPECIAL TESTS:  Straight leg raise test: Negative       GAIT: Distance walked: 30 Assistive device utilized: None Level of assistance: Complete Independence Comments: Slightly antalgic but good heel to toe progression   TODAY'S TREATMENT  TODAY'S TREATMENT  11/16/21 NuStep x 5 min level 5 Hip flexor lunges on steps x 20 each Standing hamstring stretch 2 x 20 sec at steps both Standing hip flexor/quad stretch 2 x 20 sec Supine IT band stretch modified  5 sec holds at min stretch point Supine piriformis stretch 2x 30 sec PPT x 20  PPT with Alternating bicycle x 20 PPT with double heel tap 2 x 10 PPT with isometric hip flexion (opposite hand to knee) Declined ice today (feeling good) 6/6: Therapeutic ex to activate right glute medius with stand tall in kickstand, single leg standing and SLS with left touch back to forward/hip flexion 10x Manual therapy soft tissue mob to gluteals, hamstring, lumbar paraspinals Trigger Point Dry-Needling  Treatment instructions: Expect mild to moderate muscle soreness. S/S of pneumothorax if dry needled over a lung field, and to seek immediate medical attention should they occur. Patient verbalized understanding of these instructions and education.  Patient Consent Given: Yes Education handout provided: Yes Muscles  treated:bil multifidi,  right gluteals and piriformis; right HS ES added to DN to left multifidi  and right glute 1.5 ma 10 min   Treatment response/outcome: improved soft tissue length   6/1: Discussed hold on HS and ITB strap stretch fro 2 weeks Discussed short steps with uphill walking Discussed HEP  Manual therapy soft tissue mob to gluteals, hamstring, lumbar paraspinals Trigger Point Dry-Needling  Treatment instructions: Expect mild to moderate muscle soreness. S/S of pneumothorax if dry needled over a lung field, and to seek immediate medical attention should they occur. Patient verbalized understanding of these instructions and education.  Patient Consent Given: Yes Education handout provided: Yes Muscles treated:bil multifidi,  right gluteals and piriformis; right HS ES added to DN to left multifidi and right HS 1.5 ma 10 min   Treatment response/outcome: improved soft tissue length      PATIENT EDUCATION:  Education details: Educated patient on avoiding aggressive stretching and how this could cause increased inflammation.  Person educated: Patient Education method: Explanation Education comprehension: verbalized understanding, returned demonstration, and verbal cues required     HOME EXERCISE PROGRAM: Access Code: [email or web address] URL: [email or web address] Date: 11/14/2021 Prepared by: Ruben Im  Program Notes All stretches hold x 30 seconds and do both sides  Exercises - Standing Hamstring Stretch on Chair  - 1 x daily - 7 x weekly - 1 sets - 3 reps - Standing Quad Stretch with Table and Chair Support  - 1 x daily - 7 x weekly - 3 sets - 10 reps - Seated Piriformis Stretch with Trunk Bend  - 1 x daily - 7 x weekly - 3 sets - 10 reps - Supine Figure 4 Piriformis Stretch  - 1 x daily - 7 x weekly - 3 sets - 10 reps - Supine ITB Stretch with Strap  - 1 x daily - 7 x weekly - 3 sets - 10 reps - Supine Hamstring Stretch with Strap  - 1 x daily - 7 x weekly  - 3 sets - 10 reps - Supine Posterior Pelvic Tilt  - 1 x daily - 7 x weekly - 1 sets - 20 reps - Supine 90/90 Alternating Heel Touches with Posterior Pelvic Tilt  - 1 x daily - 7 x weekly - 3 sets - 10 reps - Hooklying Isometric Hip Flexion  - 1 x daily - 7 x weekly - 1 sets - 10 reps - Clam with Resistance  - 1 x daily - 7 x weekly - 1 sets - 10 reps - Staggered Stance Gluteal Strengthening  - 1 x daily - 7 x weekly - 3 sets - 10 reps    ASSESSMENT:   CLINICAL IMPRESSION:   Blia is responding very well to  dry needling, core strengthening and flexibility.  She is very compliant and well motivated.  She is able to work outdoors doing some gardening with with less pain.  She would benefit from continued skilled PT to restore proper body mechanics and improve flexibility.   OBJECTIVE IMPAIRMENTS decreased mobility, difficulty walking, decreased strength, increased fascial restrictions, increased muscle spasms, impaired flexibility, postural dysfunction, and pain.    ACTIVITY LIMITATIONS cleaning, community activity, driving, meal prep, occupation, laundry, yard work, and shopping.    PERSONAL FACTORS Age, Fitness, Past/current experiences, and Time since onset of injury/illness/exacerbation are also affecting patient's functional outcome.      REHAB POTENTIAL: Good   CLINICAL DECISION MAKING: Stable/uncomplicated   EVALUATION COMPLEXITY: Low     GOALS: Goals reviewed with patient? Yes   SHORT TERM GOALS: Target date: 11/20/2021    (Remove Blue Hyperlink)   Independence with initial HEP  Baseline: Goal status: MET   2.  Pain report to be no greater than 4/10  Baseline:  Goal status: MET     LONG TERM GOALS: Target date: 12/18/2021  (Remove Blue Hyperlink)   Patient to be independent with advanced HEP  Baseline:  Goal status: INITIAL   2.  Patient to be independent with advanced HEP  Baseline:  Goal status: INITIAL   3.  Patient to be able to sleep through the  night. Baseline:  Goal status: INITIAL   4.  Patient to be able to resume golfing. Baseline:  Goal status: INITIAL     PLAN: PT FREQUENCY: 2x/week   PT DURATION: 8 weeks   PLANNED INTERVENTIONS: Therapeutic exercises, Therapeutic activity, Neuromuscular re-education, Balance training, Gait training, Patient/Family education, Joint mobilization, Stair training, Aquatic Therapy, Dry Needling, Electrical stimulation, Spinal mobilization, Cryotherapy, Moist heat, Taping, Traction, Ultrasound, and Manual therapy.   PLAN FOR NEXT SESSION: Continue DN/ES to lumbar, gluteals and HS;  hold supine strap ex's for HS/sciatic nerve secondary to high irritability from over stretching;  glute medius strengthening;  Review HEP, NuStep, core strengthening and hip strengthening (limited UE secondary to shoulder pain)  Anderson Malta B. Ojani Berenson, PT 11/16/21 10:18 AM  Phone: [phone] Fax: [phone]

## 2021-11-21 ENCOUNTER — Ambulatory Visit: Payer: Medicare Other

## 2021-11-21 DIAGNOSIS — M6281 Muscle weakness (generalized): Secondary | ICD-10-CM

## 2021-11-21 DIAGNOSIS — M5459 Other low back pain: Secondary | ICD-10-CM

## 2021-11-21 DIAGNOSIS — R262 Difficulty in walking, not elsewhere classified: Secondary | ICD-10-CM

## 2021-11-21 DIAGNOSIS — M25551 Pain in right hip: Secondary | ICD-10-CM

## 2021-11-21 NOTE — Therapy (Signed)
OUTPATIENT PHYSICAL THERAPY TREATMENT NOTE   Patient Name: Abigail Pruitt MRN: [id number] DOB:02-04-1946, 76 y.o., female Today's Date: 11/21/2021  PCP: Gerald Stabs, MD   REFERRING PROVIDER: Inez Catalina, MD  END OF SESSION:   PT End of Session - 11/21/21 0935     Visit Number 8    Date for PT Re-Evaluation 12/18/21    Authorization Type Medicare A and B    Progress Note Due on Visit 10    PT Start Time 0930    PT Stop Time 1015    PT Time Calculation (min) 45 min    Activity Tolerance Patient tolerated treatment well    Behavior During Therapy WFL for tasks assessed/performed             Past Medical History:  Diagnosis Date   Colon polyps    Family hx of colon cancer    Hot flashes    Severe hot flashes   Hyperlipidemia    Osteoarthritis    History reviewed. No pertinent surgical history. Patient Active Problem List   Diagnosis Date Noted   Other low back pain 10/23/2021   Pain in right hip 10/23/2021    REFERRING DIAG: M54.50 (ICD-10-CM) - Low back pain, unspecified   THERAPY DIAG:  Other low back pain  Pain in right hip  Muscle weakness (generalized)  Difficulty in walking, not elsewhere classified  PERTINENT HISTORY: see above  PRECAUTIONS: none  SUBJECTIVE:  Patient states she has minimal back pain but that her shoulders are hurting.  She had cortisone injection in right shoulder which didn't seem to help.    PAIN:  Are you having pain? Yes: NPRS scale: 0/10 Pain location: left side hip/low back and right HS, gluteals Pain description: discomfort Aggravating factors: nothing in particular Relieving factors: stretches, meds   OBJECTIVE: (objective measures completed at initial evaluation unless otherwise dated)  OBJECTIVE:    DIAGNOSTIC FINDINGS:  None of lumbar spine   PATIENT SURVEYS:  FOTO 61 (goal is 50)   SCREENING FOR RED FLAGS: Bowel or bladder incontinence: No Spinal tumors: No Cauda equina syndrome:  No Compression fracture: No Abdominal aneurysm: No   COGNITION:           Overall cognitive status: Within functional limits for tasks assessed                          SENSATION: WFL   MUSCLE LENGTH: Hamstrings: Right 60 deg; Left 50 deg Thomas test: Mild positive bilaterally   POSTURE:  WNL   LUMBAR ROM:    Active  A/PROM  10/23/2021  Flexion WNL  Extension WNL  Right lateral flexion WNL  Left lateral flexion WNL  Right rotation WNL  Left rotation WNL   (Blank rows = not tested)   LE ROM:   WNL bilaterally   LE MMT:   Generally 4 to 4+/5 with exception of hip abduction, hip extension and hip ER which were all 4-/5 bilaterally   LUMBAR SPECIAL TESTS:  Straight leg raise test: Negative       GAIT: Distance walked: 30 Assistive device utilized: None Level of assistance: Complete Independence Comments: Slightly antalgic but good heel to toe progression   TODAY'S TREATMENT  TODAY'S TREATMENT  11/21/21 NuStep x 5 min level 5 Sit to stand 2 x 10 on balance pad LAQ 2 x 10 with 6 lb ankle weights  Seated march x 20 with 6 lb ankle weight  Seated mini  sit ups with 10 lb kb Quadruped Cat/camel x 10 Quadruped hip ext 2 x 10 each Supine Iron cross stretch 3 x 20 sec  Supine hamstring stretch 3 x 20 sec at steps both Supine piriformis stretch 3 x 30 sec PPT x 20  PPT with Alternating bicycle x 20 PPT with double heel tap 2 x 10 PPT with plyo ball to SLR blue x 20 each side PPT with isometric hip flexion (opposite hand to knee) Plank against higher table x 30 sec Plank with alternating press up at bar x 10 Declined ice today   TODAY'S TREATMENT  11/16/21 NuStep x 5 min level 5 Hip flexor lunges on steps x 20 each Standing hamstring stretch 2 x 20 sec at steps both Standing hip flexor/quad stretch 2 x 20 sec Supine IT band stretch modified 5 sec holds at min stretch point Supine piriformis stretch 2x 30 sec PPT x 20  PPT with Alternating bicycle x 20 PPT  with double heel tap 2 x 10 PPT with isometric hip flexion (opposite hand to knee) Declined ice today (feeling good)  6/6: Therapeutic ex to activate right glute medius with stand tall in kickstand, single leg standing and SLS with left touch back to forward/hip flexion 10x Manual therapy soft tissue mob to gluteals, hamstring, lumbar paraspinals Trigger Point Dry-Needling  Treatment instructions: Expect mild to moderate muscle soreness. S/S of pneumothorax if dry needled over a lung field, and to seek immediate medical attention should they occur. Patient verbalized understanding of these instructions and education. Patient Consent Given: Yes Education handout provided: Yes Muscles treated:bil multifidi,  right gluteals and piriformis; right HS ES added to DN to left multifidi  and right glute 1.5 ma 10 min   Treatment response/outcome: improved soft tissue length   PATIENT EDUCATION:  Education details: Educated patient on avoiding aggressive stretching and how this could cause increased inflammation.  Person educated: Patient Education method: Explanation Education comprehension: verbalized understanding, returned demonstration, and verbal cues required     HOME EXERCISE PROGRAM: Access Code: [email or web address] URL: [email or web address] Date: 11/21/2021 Prepared by: Candyce Churn  Program Notes All stretches hold x 30 seconds and do both sides  Exercises - Standing Hamstring Stretch on Chair  - 1 x daily - 7 x weekly - 1 sets - 3 reps - Standing Quad Stretch with Table and Chair Support  - 1 x daily - 7 x weekly - 3 sets - 10 reps - Seated Piriformis Stretch with Trunk Bend  - 1 x daily - 7 x weekly - 3 sets - 10 reps - Supine Figure 4 Piriformis Stretch  - 1 x daily - 7 x weekly - 3 sets - 10 reps - Supine ITB Stretch with Strap  - 1 x daily - 7 x weekly - 3 sets - 10 reps - Supine Hamstring Stretch with Strap  - 1 x daily - 7 x weekly - 3 sets - 10 reps - Supine  Posterior Pelvic Tilt  - 1 x daily - 7 x weekly - 1 sets - 20 reps - Supine 90/90 Alternating Heel Touches with Posterior Pelvic Tilt  - 1 x daily - 7 x weekly - 3 sets - 10 reps - Hooklying Isometric Hip Flexion  - 1 x daily - 7 x weekly - 1 sets - 10 reps - Clam with Resistance  - 1 x daily - 7 x weekly - 1 sets - 10 reps - Staggered Stance Gluteal Strengthening  - 1 x  daily - 7 x weekly - 3 sets - 10 reps - Supine Transversus Abdominis Bracing with Leg Extension  - 1 x daily - 7 x weekly - 2 sets - 10 reps - Plank with Elbows on Table  - 1 x daily - 7 x weekly - 1 sets - 2 reps - 30 sec hold   ASSESSMENT:   CLINICAL IMPRESSION:   Haileigh is progressing appropriately.  She is able to tolerate more challenging core exercises with improved ability to maintain pelvic tilt.  She would benefit from continued skilled PT to restore proper body mechanics and improve flexibility.   OBJECTIVE IMPAIRMENTS decreased mobility, difficulty walking, decreased strength, increased fascial restrictions, increased muscle spasms, impaired flexibility, postural dysfunction, and pain.    ACTIVITY LIMITATIONS cleaning, community activity, driving, meal prep, occupation, laundry, yard work, and shopping.    PERSONAL FACTORS Age, Fitness, Past/current experiences, and Time since onset of injury/illness/exacerbation are also affecting patient's functional outcome.      REHAB POTENTIAL: Good   CLINICAL DECISION MAKING: Stable/uncomplicated   EVALUATION COMPLEXITY: Low     GOALS: Goals reviewed with patient? Yes   SHORT TERM GOALS: Target date: 11/20/2021    (Remove Blue Hyperlink)   Independence with initial HEP  Baseline: Goal status: MET   2.  Pain report to be no greater than 4/10  Baseline:  Goal status: MET     LONG TERM GOALS: Target date: 12/18/2021  (Remove Blue Hyperlink)   Patient to be independent with advanced HEP  Baseline:  Goal status: INITIAL   2.  Patient to be independent with  advanced HEP  Baseline:  Goal status: INITIAL   3.  Patient to be able to sleep through the night. Baseline:  Goal status: INITIAL   4.  Patient to be able to resume golfing. Baseline:  Goal status: INITIAL     PLAN: PT FREQUENCY: 2x/week   PT DURATION: 8 weeks   PLANNED INTERVENTIONS: Therapeutic exercises, Therapeutic activity, Neuromuscular re-education, Balance training, Gait training, Patient/Family education, Joint mobilization, Stair training, Aquatic Therapy, Dry Needling, Electrical stimulation, Spinal mobilization, Cryotherapy, Moist heat, Taping, Traction, Ultrasound, and Manual therapy.   PLAN FOR NEXT SESSION: Continue DN/ES to lumbar, gluteals and HS;  hold supine strap ex's for HS/sciatic nerve secondary to high irritability from over stretching;  glute medius strengthening;  Review HEP, NuStep, core strengthening and hip strengthening (limited UE secondary to shoulder pain)  Anderson Malta B. Kimberely Mccannon, PT 11/21/21 10:22 AM  [facility] [address] [place], [place] [address] Phone # [phone] Fax [phone]

## 2021-11-23 ENCOUNTER — Ambulatory Visit: Payer: Medicare Other

## 2021-11-23 DIAGNOSIS — M5459 Other low back pain: Secondary | ICD-10-CM

## 2021-11-23 DIAGNOSIS — M25551 Pain in right hip: Secondary | ICD-10-CM

## 2021-11-23 DIAGNOSIS — M6281 Muscle weakness (generalized): Secondary | ICD-10-CM

## 2021-11-23 DIAGNOSIS — R262 Difficulty in walking, not elsewhere classified: Secondary | ICD-10-CM

## 2021-11-23 NOTE — Therapy (Signed)
OUTPATIENT PHYSICAL THERAPY TREATMENT NOTE   Patient Name: Abigail Pruitt MRN: [id number] DOB:12-08-1945, 76 y.o., female Today's Date: 11/23/2021  PCP: Gerald Stabs, MD   REFERRING PROVIDER: Inez Catalina, MD  END OF SESSION:   PT End of Session - 11/23/21 1106     Visit Number 9    Date for PT Re-Evaluation 12/18/21    Authorization Type Medicare A and B    Progress Note Due on Visit 10    PT Start Time 1104    PT Stop Time 1145    PT Time Calculation (min) 41 min    Activity Tolerance Patient tolerated treatment well    Behavior During Therapy WFL for tasks assessed/performed             Past Medical History:  Diagnosis Date   Colon polyps    Family hx of colon cancer    Hot flashes    Severe hot flashes   Hyperlipidemia    Osteoarthritis    History reviewed. No pertinent surgical history. Patient Active Problem List   Diagnosis Date Noted   Other low back pain 10/23/2021   Pain in right hip 10/23/2021    REFERRING DIAG: M54.50 (ICD-10-CM) - Low back pain, unspecified   THERAPY DIAG:  Other low back pain  Pain in right hip  Muscle weakness (generalized)  Difficulty in walking, not elsewhere classified  PERTINENT HISTORY: see above  PRECAUTIONS: none  SUBJECTIVE:  Patient states she is doing great! No back or hip pain today.      PAIN:  Are you having pain? Yes: NPRS scale: 0/10 Pain location: left side hip/low back and right HS, gluteals Pain description: discomfort Aggravating factors: nothing in particular Relieving factors: stretches, meds   OBJECTIVE: (objective measures completed at initial evaluation unless otherwise dated)  OBJECTIVE:    DIAGNOSTIC FINDINGS:  None of lumbar spine   PATIENT SURVEYS:  FOTO 61 (goal is 27)   SCREENING FOR RED FLAGS: Bowel or bladder incontinence: No Spinal tumors: No Cauda equina syndrome: No Compression fracture: No Abdominal aneurysm: No   COGNITION:           Overall cognitive  status: Within functional limits for tasks assessed                          SENSATION: WFL   MUSCLE LENGTH: Hamstrings: Right 60 deg; Left 50 deg Thomas test: Mild positive bilaterally   POSTURE:  WNL   LUMBAR ROM:    Active  A/PROM  10/23/2021  Flexion WNL  Extension WNL  Right lateral flexion WNL  Left lateral flexion WNL  Right rotation WNL  Left rotation WNL   (Blank rows = not tested)   LE ROM:   WNL bilaterally   LE MMT:   Generally 4 to 4+/5 with exception of hip abduction, hip extension and hip ER which were all 4-/5 bilaterally   LUMBAR SPECIAL TESTS:  Straight leg raise test: Negative       GAIT: Distance walked: 30 Assistive device utilized: None Level of assistance: Complete Independence Comments: Slightly antalgic but good heel to toe progression   TODAY'S TREATMENT  TODAY'S TREATMENT  11/23/21 NuStep x 5 min level 5 Supine Iron cross stretch 3 x 20 sec  Supine hamstring stretch 3 x 20 sec at steps both Supine piriformis stretch 3 x 30 sec PPT x 20  PPT with Alternating bicycle x 20 PPT with double heel tap 2 x  10 PPT with plyo ball to SLR blue x 20 each side PPT with isometric hip flexion (opposite hand to knee) Quadruped Cat/camel x 10 Quadruped hip ext 2 x 10 each Plank against higher table x 30 sec Sit to stand 2 x 10  Seated march x 20 with 6 lb ankle weight  Seated mini sit ups with 10 lb kb Declined ice today   TODAY'S TREATMENT  11/21/21 NuStep x 5 min level 5 Sit to stand 2 x 10 on balance pad LAQ 2 x 10 with 6 lb ankle weights  Seated march x 20 with 6 lb ankle weight  Seated mini sit ups with 10 lb kb Quadruped Cat/camel x 10 Quadruped hip ext 2 x 10 each Supine Iron cross stretch 3 x 20 sec  Supine hamstring stretch 3 x 20 sec at steps both Supine piriformis stretch 3 x 30 sec PPT x 20  PPT with Alternating bicycle x 20 PPT with double heel tap 2 x 10 PPT with plyo ball to SLR blue x 20 each side PPT with isometric  hip flexion (opposite hand to knee) Plank against higher table x 30 sec Plank with alternating press up at bar x 10 Declined ice today  TODAY'S TREATMENT  11/16/21 NuStep x 5 min level 5 Hip flexor lunges on steps x 20 each Standing hamstring stretch 2 x 20 sec at steps both Standing hip flexor/quad stretch 2 x 20 sec Supine IT band stretch modified 5 sec holds at min stretch point Supine piriformis stretch 2x 30 sec PPT x 20  PPT with Alternating bicycle x 20 PPT with double heel tap 2 x 10 PPT with isometric hip flexion (opposite hand to knee) Declined ice today (feeling good)  PATIENT EDUCATION:  Education details: Educated patient on avoiding aggressive stretching and how this could cause increased inflammation.  Person educated: Patient Education method: Explanation Education comprehension: verbalized understanding, returned demonstration, and verbal cues required     HOME EXERCISE PROGRAM: Access Code: [email or web address] URL: [email or web address] Date: 11/21/2021 Prepared by: Candyce Churn  Program Notes All stretches hold x 30 seconds and do both sides  Exercises - Standing Hamstring Stretch on Chair  - 1 x daily - 7 x weekly - 1 sets - 3 reps - Standing Quad Stretch with Table and Chair Support  - 1 x daily - 7 x weekly - 3 sets - 10 reps - Seated Piriformis Stretch with Trunk Bend  - 1 x daily - 7 x weekly - 3 sets - 10 reps - Supine Figure 4 Piriformis Stretch  - 1 x daily - 7 x weekly - 3 sets - 10 reps - Supine ITB Stretch with Strap  - 1 x daily - 7 x weekly - 3 sets - 10 reps - Supine Hamstring Stretch with Strap  - 1 x daily - 7 x weekly - 3 sets - 10 reps - Supine Posterior Pelvic Tilt  - 1 x daily - 7 x weekly - 1 sets - 20 reps - Supine 90/90 Alternating Heel Touches with Posterior Pelvic Tilt  - 1 x daily - 7 x weekly - 3 sets - 10 reps - Hooklying Isometric Hip Flexion  - 1 x daily - 7 x weekly - 1 sets - 10 reps - Clam with Resistance  - 1 x daily  - 7 x weekly - 1 sets - 10 reps - Staggered Stance Gluteal Strengthening  - 1 x daily - 7 x weekly - 3 sets -  10 reps - Supine Transversus Abdominis Bracing with Leg Extension  - 1 x daily - 7 x weekly - 2 sets - 10 reps - Plank with Elbows on Table  - 1 x daily - 7 x weekly - 1 sets - 2 reps - 30 sec hold   ASSESSMENT:   CLINICAL IMPRESSION:   Mouna is having more consistent relief of pain symptoms.  She is very compliant and well motivated with her HEP.    She would benefit from continued skilled PT to restore proper body mechanics and improve flexibility.   OBJECTIVE IMPAIRMENTS decreased mobility, difficulty walking, decreased strength, increased fascial restrictions, increased muscle spasms, impaired flexibility, postural dysfunction, and pain.    ACTIVITY LIMITATIONS cleaning, community activity, driving, meal prep, occupation, laundry, yard work, and shopping.    PERSONAL FACTORS Age, Fitness, Past/current experiences, and Time since onset of injury/illness/exacerbation are also affecting patient's functional outcome.      REHAB POTENTIAL: Good   CLINICAL DECISION MAKING: Stable/uncomplicated   EVALUATION COMPLEXITY: Low     GOALS: Goals reviewed with patient? Yes   SHORT TERM GOALS: Target date: 11/20/2021    (Remove Blue Hyperlink)   Independence with initial HEP  Baseline: Goal status: MET   2.  Pain report to be no greater than 4/10  Baseline:  Goal status: MET     LONG TERM GOALS: Target date: 12/18/2021  (Remove Blue Hyperlink)   Patient to be independent with advanced HEP  Baseline:  Goal status: INITIAL   2.  Patient to be independent with advanced HEP  Baseline:  Goal status: INITIAL   3.  Patient to be able to sleep through the night. Baseline:  Goal status: INITIAL   4.  Patient to be able to resume golfing. Baseline:  Goal status: INITIAL     PLAN: PT FREQUENCY: 2x/week   PT DURATION: 8 weeks   PLANNED INTERVENTIONS: Therapeutic exercises,  Therapeutic activity, Neuromuscular re-education, Balance training, Gait training, Patient/Family education, Joint mobilization, Stair training, Aquatic Therapy, Dry Needling, Electrical stimulation, Spinal mobilization, Cryotherapy, Moist heat, Taping, Traction, Ultrasound, and Manual therapy.   PLAN FOR NEXT SESSION: Continue DN/ES to lumbar, gluteals and HS;  hold supine strap ex's for HS/sciatic nerve secondary to high irritability from over stretching;  glute medius strengthening;  Review HEP, NuStep, core strengthening and hip strengthening (limited UE secondary to shoulder pain)  Anderson Malta B. Rosette Bellavance, PT 11/23/21 9:43 PM  [facility] [address], [address] [place], [place] [address] Phone # [phone] Fax [phone]

## 2021-11-28 ENCOUNTER — Telehealth: Payer: Self-pay

## 2021-11-28 ENCOUNTER — Ambulatory Visit: Payer: Medicare Other

## 2021-11-28 DIAGNOSIS — M5459 Other low back pain: Secondary | ICD-10-CM

## 2021-11-28 DIAGNOSIS — R262 Difficulty in walking, not elsewhere classified: Secondary | ICD-10-CM

## 2021-11-28 DIAGNOSIS — M25551 Pain in right hip: Secondary | ICD-10-CM

## 2021-11-28 DIAGNOSIS — M6281 Muscle weakness (generalized): Secondary | ICD-10-CM

## 2021-11-28 NOTE — Telephone Encounter (Signed)
Call place to request referral to eval and treat bilateral shoulder pain.  Spoke with receptionist at 11:47 am.  She will send message to his assistant and they will call us back.

## 2021-11-28 NOTE — Therapy (Signed)
OUTPATIENT PHYSICAL THERAPY TREATMENT NOTE   Patient Name: Abigail Pruitt MRN: [id number] DOB:[birth year], 76 y.o., female Today's Date: 11/28/2021  PCP: Gerald Stabs, MD   REFERRING PROVIDER: Inez Catalina, MD  END OF SESSION:   PT End of Session - 11/28/21 0934     Visit Number 10    Date for PT Re-Evaluation 12/18/21    Authorization Type Medicare A and B    Progress Note Due on Visit 20    PT Start Time 0930    PT Stop Time 1015    PT Time Calculation (min) 45 min    Activity Tolerance Patient tolerated treatment well    Behavior During Therapy WFL for tasks assessed/performed             Past Medical History:  Diagnosis Date   Colon polyps    Family hx of colon cancer    Hot flashes    Severe hot flashes   Hyperlipidemia    Osteoarthritis    History reviewed. No pertinent surgical history. Patient Active Problem List   Diagnosis Date Noted   Other low back pain 10/23/2021   Pain in right hip 10/23/2021    REFERRING DIAG: M54.50 (ICD-10-CM) - Low back pain, unspecified   THERAPY DIAG:  Other low back pain  Pain in right hip  Muscle weakness (generalized)  Difficulty in walking, not elsewhere classified  PERTINENT HISTORY: see above  PRECAUTIONS: none  SUBJECTIVE:  Patient states she did well on her trip despite not having time to do her exercises.   She denies any pain today and was able to stand in her kitchen and do some cooking and cleaning which included some prolonged standing that she wasn't able to do previously.    "My shoulders have been really bothering me more"  We discuss possibly getting referral from MD to assess the shoulders and begin treatment for this.    PAIN:  Are you having pain? No: NPRS scale: 0/10 Pain location: left side hip/low back and right HS, gluteals Pain description: discomfort Aggravating factors: nothing in particular Relieving factors: stretches, meds   Progress Note Reporting Period 10/23/21 to  11/28/21  See note below for Objective Data and Assessment of Progress/Goals.     OBJECTIVE: (objective measures completed at initial evaluation unless otherwise dated)  OBJECTIVE:    DIAGNOSTIC FINDINGS:  None of lumbar spine   PATIENT SURVEYS:  FOTO 61 (goal is 14)  Re-assessment 11/29/19: 69   SCREENING FOR RED FLAGS: Bowel or bladder incontinence: No Spinal tumors: No Cauda equina syndrome: No Compression fracture: No Abdominal aneurysm: No   COGNITION:           Overall cognitive status: Within functional limits for tasks assessed                          SENSATION: WFL   MUSCLE LENGTH: Hamstrings: Right 60 deg; Left 50 deg  Re-assessment 11/23/21: right 65 degrees and left 60 degrees Thomas test: Mild positive bilaterally   POSTURE:  WNL   LUMBAR ROM:    Active  A/PROM  10/23/2021  Flexion WNL  Extension WNL  Right lateral flexion WNL  Left lateral flexion WNL  Right rotation WNL  Left rotation WNL   (Blank rows = not tested)   LE ROM:   WNL bilaterally   LE MMT:   Generally 4 to 4+/5 with exception of hip abduction, hip extension and hip ER which were all  4-/5 bilaterally  Re-assessment 11/29/19: all 4+/5, hip extension improved to 4/5, hip ER and IR now 4+/5   LUMBAR SPECIAL TESTS:  Straight leg raise test: Negative       GAIT: Distance walked: 30 Assistive device utilized: None Level of assistance: Complete Independence Comments: Slightly antalgic but good heel to toe progression   TODAY'S TREATMENT  TODAY'S TREATMENT  11/28/21 Re-assessment for 10th visit Supine Iron cross stretch 3 x 20 sec  Supine hamstring stretch 3 x 20 sec at steps both Supine piriformis stretch 3 x 30 sec PPT x 20  PPT with Alternating bicycle x 20 PPT with double heel tap 2 x 10 PPT with plyo ball to SLR blue x 20 each side Quadruped Cat/camel x 10 Quadruped hip ext 2 x 10 each Declined ice today   TODAY'S TREATMENT  11/23/21 NuStep x 5 min level 5 Supine  Iron cross stretch 3 x 20 sec  Supine hamstring stretch 3 x 20 sec at steps both Supine piriformis stretch 3 x 30 sec PPT x 20  PPT with Alternating bicycle x 20 PPT with double heel tap 2 x 10 PPT with plyo ball to SLR blue x 20 each side PPT with isometric hip flexion (opposite hand to knee) Quadruped Cat/camel x 10 Quadruped hip ext 2 x 10 each Plank against higher table x 30 sec Sit to stand 2 x 10  Seated march x 20 with 6 lb ankle weight  Seated mini sit ups with 10 lb kb Declined ice today   TODAY'S TREATMENT  11/21/21 NuStep x 5 min level 5 Sit to stand 2 x 10 on balance pad LAQ 2 x 10 with 6 lb ankle weights  Seated march x 20 with 6 lb ankle weight  Seated mini sit ups with 10 lb kb Quadruped Cat/camel x 10 Quadruped hip ext 2 x 10 each Supine Iron cross stretch 3 x 20 sec  Supine hamstring stretch 3 x 20 sec at steps both Supine piriformis stretch 3 x 30 sec PPT x 20  PPT with Alternating bicycle x 20 PPT with double heel tap 2 x 10 PPT with plyo ball to SLR blue x 20 each side PPT with isometric hip flexion (opposite hand to knee) Plank against higher table x 30 sec Plank with alternating press up at bar x 10 Declined ice today   PATIENT EDUCATION:  Education details: Educated patient on avoiding aggressive stretching and how this could cause increased inflammation.  Person educated: Patient Education method: Explanation Education comprehension: verbalized understanding, returned demonstration, and verbal cues required     HOME EXERCISE PROGRAM: Access Code: [email or web address] URL: [email or web address] Date: 11/21/2021 Prepared by: Candyce Churn  Program Notes All stretches hold x 30 seconds and do both sides  Exercises - Standing Hamstring Stretch on Chair  - 1 x daily - 7 x weekly - 1 sets - 3 reps - Standing Quad Stretch with Table and Chair Support  - 1 x daily - 7 x weekly - 3 sets - 10 reps - Seated Piriformis Stretch with Trunk Bend  - 1  x daily - 7 x weekly - 3 sets - 10 reps - Supine Figure 4 Piriformis Stretch  - 1 x daily - 7 x weekly - 3 sets - 10 reps - Supine ITB Stretch with Strap  - 1 x daily - 7 x weekly - 3 sets - 10 reps - Supine Hamstring Stretch with Strap  - 1 x daily - 7 x weekly -  3 sets - 10 reps - Supine Posterior Pelvic Tilt  - 1 x daily - 7 x weekly - 1 sets - 20 reps - Supine 90/90 Alternating Heel Touches with Posterior Pelvic Tilt  - 1 x daily - 7 x weekly - 3 sets - 10 reps - Hooklying Isometric Hip Flexion  - 1 x daily - 7 x weekly - 1 sets - 10 reps - Clam with Resistance  - 1 x daily - 7 x weekly - 1 sets - 10 reps - Staggered Stance Gluteal Strengthening  - 1 x daily - 7 x weekly - 3 sets - 10 reps - Supine Transversus Abdominis Bracing with Leg Extension  - 1 x daily - 7 x weekly - 2 sets - 10 reps - Plank with Elbows on Table  - 1 x daily - 7 x weekly - 1 sets - 2 reps - 30 sec hold   ASSESSMENT:   CLINICAL IMPRESSION:   Ksenia demonstrates improvement on all objective data.  Her pain in her low back is well controlled but she is having a significant amount of bilateral shoulder pain.   She would benefit from continued skilled PT to address shoulder issues and continue to progress core stabilization. We will contact Dr. Sheppard Coil to obtain order for shoulder eval and treat.    OBJECTIVE IMPAIRMENTS decreased mobility, difficulty walking, decreased strength, increased fascial restrictions, increased muscle spasms, impaired flexibility, postural dysfunction, and pain.    ACTIVITY LIMITATIONS cleaning, community activity, driving, meal prep, occupation, laundry, yard work, and shopping.    PERSONAL FACTORS Age, Fitness, Past/current experiences, and Time since onset of injury/illness/exacerbation are also affecting patient's functional outcome.      REHAB POTENTIAL: Good   CLINICAL DECISION MAKING: Stable/uncomplicated   EVALUATION COMPLEXITY: Low     GOALS: Goals reviewed with patient? Yes    SHORT TERM GOALS: Target date: 11/20/2021    (Remove Blue Hyperlink)   Independence with initial HEP  Baseline: Goal status: MET   2.  Pain report to be no greater than 4/10  Baseline:  Goal status: MET     LONG TERM GOALS: Target date: 12/18/2021  (Remove Blue Hyperlink)   Patient to be independent with advanced HEP  Baseline:  Goal status: In progress    2.  Patient to report pain no greater than 2/10   Baseline:  Goal status: MET   3.  Patient to be able to sleep through the night. Baseline:  Goal status: In progress   4.  Patient to be able to resume golfing. Baseline:  Goal status:      PLAN: PT FREQUENCY: 2x/week   PT DURATION: 8 weeks   PLANNED INTERVENTIONS: Therapeutic exercises, Therapeutic activity, Neuromuscular re-education, Balance training, Gait training, Patient/Family education, Joint mobilization, Stair training, Aquatic Therapy, Dry Needling, Electrical stimulation, Spinal mobilization, Cryotherapy, Moist heat, Taping, Traction, Ultrasound, and Manual therapy.   PLAN FOR NEXT SESSION: Obtain order from Dr. Sheppard Coil to assess bilateral shoulders.   Call placed on 11/28/21 at 11:46 am to obtain order.  Continue DN/ES to lumbar, gluteals and HS;  hold supine strap ex's for HS/sciatic nerve secondary to high irritability from over stretching;  glute medius strengthening, NuStep, core strengthening and hip strengthening (limited UE secondary to shoulder pain)  Anderson Malta B. Ava Tangney, PT 11/28/21 11:58 AM  [facility] [address] [place], [place] [address] Phone # [phone] Fax [phone]

## 2021-11-30 ENCOUNTER — Ambulatory Visit: Payer: Medicare Other | Admitting: Physical Therapy

## 2021-11-30 DIAGNOSIS — M6281 Muscle weakness (generalized): Secondary | ICD-10-CM

## 2021-11-30 DIAGNOSIS — M25551 Pain in right hip: Secondary | ICD-10-CM

## 2021-11-30 DIAGNOSIS — G8929 Other chronic pain: Secondary | ICD-10-CM

## 2021-11-30 DIAGNOSIS — M5459 Other low back pain: Secondary | ICD-10-CM | POA: Diagnosis not present

## 2021-11-30 NOTE — Addendum Note (Signed)
Addended by: Lavinia Sharps C on: 11/30/2021 01:00 PM   Modules accepted: Orders

## 2021-11-30 NOTE — Therapy (Signed)
OUTPATIENT PHYSICAL THERAPY TREATMENT NOTE/shoulder evaluation   Patient Name: Abigail Pruitt MRN: [id number] DOB:April 01, 1946, 76 y.o., female Today's Date: 11/30/2021  PCP: Gerald Stabs, MD   REFERRING PROVIDER: Inez Catalina, MD  END OF SESSION:   PT End of Session - 11/30/21 0930     Visit Number 11    Date for PT Re-Evaluation 01/25/22    Authorization Type Medicare A and B    Progress Note Due on Visit 20    PT Start Time 0930    PT Stop Time 1013    PT Time Calculation (min) 43 min    Activity Tolerance Patient tolerated treatment well             Past Medical History:  Diagnosis Date   Colon polyps    Family hx of colon cancer    Hot flashes    Severe hot flashes   Hyperlipidemia    Osteoarthritis    No past surgical history on file. Patient Active Problem List   Diagnosis Date Noted   Other low back pain 10/23/2021   Pain in right hip 10/23/2021    REFERRING DIAG: M54.50 (ICD-10-CM) - Low back pain, unspecified   THERAPY DIAG:  Other low back pain  Pain in right hip  Muscle weakness (generalized)  Chronic right shoulder pain  Chronic left shoulder pain  PERTINENT HISTORY: see above  PRECAUTIONS: none  SUBJECTIVE:  A little sore in back but overall good.  I'm in so much pain in right upper arm > left.  Difficulty sleeping.  I can't sleep on my back.   The exercises help. Ok with daytime use.  The shot felt great that day 2-3 weeks ago but pain returned.  Sees on the 12th.   No imaging or MRI done.    PAIN:  Are you having pain?yes: NPRS scale: 4/10 Pain location: right shoulder > left shoulder and upper arm Pain description: discomfort Aggravating factors: sleeping on either side Relieving factors: patches, aspercreme; squeezing shoulder blades  Goal  get ready for trip to [place] 8/30    OBJECTIVE:   DIAGNOSTIC FINDINGS:  None for shoulder  PATIENT SURVEYS:  Quick Dash 15.909   POSTURE: good  UPPER EXTREMITY ROM:    Active ROM Right eval Left eval  Shoulder flexion 157 162  Shoulder extension    Shoulder abduction 170 174  Shoulder adduction    Shoulder internal rotation T6 T4  Shoulder external rotation 84 78  Elbow flexion    Elbow extension    Wrist flexion    Wrist extension    Wrist ulnar deviation    Wrist radial deviation    Wrist pronation    Wrist supination    (Blank rows = not tested)  UPPER EXTREMITY MMT:  MMT Right eval Left eval  Shoulder flexion 4+ 4+  Shoulder extension    Shoulder abduction 4+ 4+  Shoulder adduction    Shoulder internal rotation 4+ 4+  Shoulder external rotation 4+ 4+  Middle trapezius 4 4  Lower trapezius 4 4  Elbow flexion    Elbow extension    Wrist flexion    Wrist extension    Wrist ulnar deviation    Wrist radial deviation    Wrist pronation    Wrist supination    Grip strength (lbs)    (Blank rows = not tested)  SHOULDER SPECIAL TESTS:  Impingement tests: Hawkins/Kennedy impingement test: negative and Painful arc test: negative    Rotator cuff assessment: Drop  arm test: negative, Empty can test: negative, and Infraspinatus test: negative     PALPATION:  No tenderness bicipital groove or subacromial region     DIAGNOSTIC FINDINGS:  None of lumbar spine   PATIENT SURVEYS:  FOTO 61 (goal is 67)  Re-assessment 11/29/19: 69   SCREENING FOR RED FLAGS: Bowel or bladder incontinence: No Spinal tumors: No Cauda equina syndrome: No Compression fracture: No Abdominal aneurysm: No   COGNITION:           Overall cognitive status: Within functional limits for tasks assessed                          SENSATION: WFL   MUSCLE LENGTH: Hamstrings: Right 60 deg; Left 50 deg  Re-assessment 11/23/21: right 65 degrees and left 60 degrees Thomas test: Mild positive bilaterally   POSTURE:  WNL     LUMBAR ROM:    Active  A/PROM  10/23/2021  Flexion WNL  Extension WNL  Right lateral flexion WNL  Left lateral flexion WNL   Right rotation WNL  Left rotation WNL   (Blank rows = not tested)   LE ROM:   WNL bilaterally   LE MMT:   Generally 4 to 4+/5 with exception of hip abduction, hip extension and hip ER which were all 4-/5 bilaterally  Re-assessment 11/29/19: all 4+/5, hip extension improved to 4/5, hip ER and IR now 4+/5   LUMBAR SPECIAL TESTS:  Straight leg raise test: Negative       GAIT: Distance walked: 30 Assistive device utilized: None Level of assistance: Complete Independence Comments: Slightly antalgic but good heel to toe progression   TODAY'S TREATMENT  11/30/21; Evaluation of bil shoulders per new referral; see objective findings above Discussion on positioning to encourage increased blood flow to tendons at night and sitting Review of shoulder ex's currently doing: bent row, shoulder extension and shoulder horizontal abduction (currently doing 2x10) Red band vertical rows 20x Red band bil shoulder extension on doorknob 20x Red band bil shoulder extension anchored over the top of the door 20x Neuromuscular re-education to activate scapular retractors and depressors           TODAY'S TREATMENT  11/28/21 Re-assessment for 10th visit Supine Iron cross stretch 3 x 20 sec  Supine hamstring stretch 3 x 20 sec at steps both Supine piriformis stretch 3 x 30 sec PPT x 20  PPT with Alternating bicycle x 20 PPT with double heel tap 2 x 10 PPT with plyo ball to SLR blue x 20 each side Quadruped Cat/camel x 10 Quadruped hip ext 2 x 10 each Declined ice today   PATIENT EDUCATION:  Education details: Educated patient on avoiding aggressive stretching and how this could cause increased inflammation.  Person educated: Patient Education method: Explanation Education comprehension: verbalized understanding, returned demonstration, and verbal cues required     HOME EXERCISE PROGRAM: Access Code: [email or web address] URL: [email or web address] Date: 11/30/2021 Prepared by:  Ruben Im  Program Notes All stretches hold x 30 seconds and do both sides  Exercises - Standing Hamstring Stretch on Chair  - 1 x daily - 7 x weekly - 1 sets - 3 reps - Standing Quad Stretch with Table and Chair Support  - 1 x daily - 7 x weekly - 3 sets - 10 reps - Seated Piriformis Stretch with Trunk Bend  - 1 x daily - 7 x weekly - 3 sets - 10 reps - Supine Figure 4 Piriformis Stretch  -  1 x daily - 7 x weekly - 3 sets - 10 reps - Supine ITB Stretch with Strap  - 1 x daily - 7 x weekly - 3 sets - 10 reps - Supine Hamstring Stretch with Strap  - 1 x daily - 7 x weekly - 3 sets - 10 reps - Supine Posterior Pelvic Tilt  - 1 x daily - 7 x weekly - 1 sets - 20 reps - Supine 90/90 Alternating Heel Touches with Posterior Pelvic Tilt  - 1 x daily - 7 x weekly - 3 sets - 10 reps - Hooklying Isometric Hip Flexion  - 1 x daily - 7 x weekly - 1 sets - 10 reps - Clam with Resistance  - 1 x daily - 7 x weekly - 1 sets - 10 reps - Staggered Stance Gluteal Strengthening  - 1 x daily - 7 x weekly - 3 sets - 10 reps - Supine Transversus Abdominis Bracing with Leg Extension  - 1 x daily - 7 x weekly - 2 sets - 10 reps - Plank with Elbows on Table  - 1 x daily - 7 x weekly - 1 sets - 2 reps - 30 sec hold - Standing Row with Anchored Resistance  - 1 x daily - 7 x weekly - 2 sets - 10 reps - Shoulder extension with resistance - Neutral  - 1 x daily - 7 x weekly - 2 sets - 10 reps - Wall Push Up  - 1 x daily - 7 x weekly - 2 sets - 10 reps - Plank with Elbows on Table  - 1 x daily - 7 x weekly - 1 sets - 2 reps - 30 sec hold   ASSESSMENT:   CLINICAL IMPRESSION:     A new order was received to evaluate and treat bil shoulders in conjunction with her back rehab.  She states her back has been doing well overall lately.  Her main complaint is right > left shoulder pain particularly at night time.  Because of other medical issues she is unable to sleep on her back and goes back and forth with sidelying on  each shoulder.  Her ROM is WFLS in both shoulders.  Glenohumeral strength grossly 4+/5.  Middle and lower trap strength 4/5.  Negative special tests for cuff tear.  Suspect over-reliance on cuff musculature and would benefit with strengthening periscapular muscles in conjunction with patient education.    OBJECTIVE IMPAIRMENTS decreased mobility, difficulty walking, decreased strength, increased fascial restrictions, increased muscle spasms, impaired flexibility, postural dysfunction, and pain.    ACTIVITY LIMITATIONS cleaning, community activity, driving, meal prep, occupation, laundry, yard work, and shopping.    PERSONAL FACTORS Age, Fitness, Past/current experiences, and Time since onset of injury/illness/exacerbation are also affecting patient's functional outcome.      REHAB POTENTIAL: Good   CLINICAL DECISION MAKING: Stable/uncomplicated   EVALUATION COMPLEXITY: Low     GOALS: Goals reviewed with patient? Yes   SHORT TERM GOALS: Target date: 11/20/2021    (Remove Blue Hyperlink)   Independence with initial HEP  Baseline: Goal status: MET   2.  Pain report to be no greater than 4/10  Baseline:  Goal status: MET     LONG TERM GOALS: Target date: 01/25/22   Patient to be independent with advanced HEP  Baseline:  Goal status: In progress    2.  Patient to report pain no greater than 2/10  LBP Baseline:  Goal status: MET   3.  Patient will report a 50% improvement in sleep (shoulders and back) Baseline:  Goal status: In progress   4.  Patient to be able to resume golfing. Baseline:  Goal status:  5. The patient will have grossly 4+/5 to 5-/5 strength in shoulder/scapular muscles needed to move her rolling suitcase on upcoming trip Goal status: New  6. Quick DASH score improved to 12.9 or less   PLAN: PT FREQUENCY: 2x/week   PT DURATION: 8 weeks   PLANNED INTERVENTIONS: Therapeutic exercises, Therapeutic activity, Neuromuscular re-education, Balance training,  Gait training, Patient/Family education, Joint mobilization, Stair training, Aquatic Therapy, Dry Needling, Electrical stimulation, Spinal mobilization, Cryotherapy, Moist heat, Taping, Traction, Ultrasound, and Manual therapy.   PLAN FOR NEXT SESSION:  review band ex's retraction and shoulder extension, wall push ups;  may add light weight to bent rows, extension and horizontal abduction;    Continue DN/ES to lumbar, gluteals and HS as needed;  hold supine strap ex's for HS/sciatic nerve secondary to high irritability from over stretching;  glute medius strengthening, NuStep, core strengthening and hip strengthening (limited UE secondary to shoulder pain)   Ruben Im, PT 11/30/21 12:56 PM Phone: [phone] Fax: [facility] [address], [place] [address] [place], [place] [address] Phone # [phone] Fax [phone]

## 2021-12-05 ENCOUNTER — Ambulatory Visit: Payer: Medicare Other

## 2021-12-05 DIAGNOSIS — M5459 Other low back pain: Secondary | ICD-10-CM | POA: Diagnosis not present

## 2021-12-05 DIAGNOSIS — R262 Difficulty in walking, not elsewhere classified: Secondary | ICD-10-CM

## 2021-12-05 DIAGNOSIS — G8929 Other chronic pain: Secondary | ICD-10-CM

## 2021-12-05 DIAGNOSIS — M6281 Muscle weakness (generalized): Secondary | ICD-10-CM

## 2021-12-05 DIAGNOSIS — M25551 Pain in right hip: Secondary | ICD-10-CM

## 2021-12-07 ENCOUNTER — Ambulatory Visit: Payer: Medicare Other | Admitting: Physical Therapy

## 2021-12-07 DIAGNOSIS — M6281 Muscle weakness (generalized): Secondary | ICD-10-CM

## 2021-12-07 DIAGNOSIS — M25551 Pain in right hip: Secondary | ICD-10-CM

## 2021-12-07 DIAGNOSIS — M5459 Other low back pain: Secondary | ICD-10-CM | POA: Diagnosis not present

## 2021-12-07 DIAGNOSIS — G8929 Other chronic pain: Secondary | ICD-10-CM

## 2021-12-07 NOTE — Therapy (Signed)
OUTPATIENT PHYSICAL THERAPY TREATMENT NOTE Patient Name: Abigail Pruitt MRN: [id number] DOB:1945/09/26, 76 y.o., female Today's Date: 12/07/2021  PCP: Gerald Stabs, MD   REFERRING PROVIDER: Inez Catalina, MD  END OF SESSION:   PT End of Session - 12/07/21 0935     Visit Number 13    Date for PT Re-Evaluation 01/25/22    Authorization Type Medicare A and B    Progress Note Due on Visit 20    PT Start Time 0930    PT Stop Time 1012    PT Time Calculation (min) 42 min    Activity Tolerance Patient tolerated treatment well             Past Medical History:  Diagnosis Date   Colon polyps    Family hx of colon cancer    Hot flashes    Severe hot flashes   Hyperlipidemia    Osteoarthritis    No past surgical history on file. Patient Active Problem List   Diagnosis Date Noted   Other low back pain 10/23/2021   Pain in right hip 10/23/2021    REFERRING DIAG: M54.50 (ICD-10-CM) - Low back pain, unspecified   THERAPY DIAG:  Other low back pain  Pain in right hip  Muscle weakness (generalized)  Chronic right shoulder pain  Chronic left shoulder pain  PERTINENT HISTORY: see above  PRECAUTIONS: none  SUBJECTIVE:  I'm doing pretty well.  I'm not sure about some of the ex's, I'd like to review those so I can do the routine while I'm away.    PAIN:  Are you having pain?yes: NPRS scale: 0/10 Pain location: right shoulder > left shoulder and upper arm Pain description: discomfort Aggravating factors: sleeping on either side Relieving factors: patches, aspercreme; squeezing shoulder blades  Goal  get ready for trip to [place] 8/30    OBJECTIVE:   DIAGNOSTIC FINDINGS:  None for shoulder  PATIENT SURVEYS:  Quick Dash 15.909   POSTURE: good  UPPER EXTREMITY ROM:   Active ROM Right eval Left eval  Shoulder flexion 157 162  Shoulder extension    Shoulder abduction 170 174  Shoulder adduction    Shoulder internal rotation T6 T4  Shoulder  external rotation 84 78  Elbow flexion    Elbow extension    Wrist flexion    Wrist extension    Wrist ulnar deviation    Wrist radial deviation    Wrist pronation    Wrist supination    (Blank rows = not tested)  UPPER EXTREMITY MMT:  MMT Right eval Left eval  Shoulder flexion 4+ 4+  Shoulder extension    Shoulder abduction 4+ 4+  Shoulder adduction    Shoulder internal rotation 4+ 4+  Shoulder external rotation 4+ 4+  Middle trapezius 4 4  Lower trapezius 4 4  Elbow flexion    Elbow extension    Wrist flexion    Wrist extension    Wrist ulnar deviation    Wrist radial deviation    Wrist pronation    Wrist supination    Grip strength (lbs)    (Blank rows = not tested)  SHOULDER SPECIAL TESTS:  Impingement tests: Hawkins/Kennedy impingement test: negative and Painful arc test: negative    Rotator cuff assessment: Drop arm test: negative, Empty can test: negative, and Infraspinatus test: negative     PALPATION:  No tenderness bicipital groove or subacromial region     DIAGNOSTIC FINDINGS:  None of lumbar spine   PATIENT SURVEYS:  FOTO 61 (goal is 3)  Re-assessment 11/29/19: 69   SCREENING FOR RED FLAGS: Bowel or bladder incontinence: No Spinal tumors: No Cauda equina syndrome: No Compression fracture: No Abdominal aneurysm: No   COGNITION:           Overall cognitive status: Within functional limits for tasks assessed                          SENSATION: WFL   MUSCLE LENGTH: Hamstrings: Right 60 deg; Left 50 deg  Re-assessment 11/23/21: right 65 degrees and left 60 degrees Thomas test: Mild positive bilaterally   POSTURE:  WNL     LUMBAR ROM:    Active  A/PROM  10/23/2021  Flexion WNL  Extension WNL  Right lateral flexion WNL  Left lateral flexion WNL  Right rotation WNL  Left rotation WNL   (Blank rows = not tested)   LE ROM:   WNL bilaterally   LE MMT:   Generally 4 to 4+/5 with exception of hip abduction, hip extension and hip  ER which were all 4-/5 bilaterally  Re-assessment 11/29/19: all 4+/5, hip extension improved to 4/5, hip ER and IR now 4+/5   LUMBAR SPECIAL TESTS:  Straight leg raise test: Negative       GAIT: Distance walked: 30 Assistive device utilized: None Level of assistance: Complete Independence Comments: Slightly antalgic but good heel to toe progression   TODAY'S TREATMENT  6/29: Comprehensive review of HEP  Glute med stand tall ex SLS 10x Given green band with "handles" secondary to thumb pain for rows and extensions Supine serratus punch x 20  with 1 lb Supine abdominal brace with leg lowering 10x each side Standing shoulder flexion 1# 20x Standing shoulder scaption 1# 20x Countertop plank on elbows 3x     11/30/21: UBE x 5 min (2.5 min fwd and 2.5 min reverse) Review of tband exercises for shoulders: standing shoulder ext, rows, bilateral ER and horizontal abd x 20 each (or 2 sets of 10) Added shoulder ext, rows and horizontal abd in prone (towel roll under forehead) x 20 each with 1 lb Added sidelying shoulder ER with 1 lb x 20 Supine serratus punch x 20  with 1 lb Education on anatomy of the shoulder and avoiding overhead reach  11/30/21; Evaluation of bil shoulders per new referral; see objective findings above Discussion on positioning to encourage increased blood flow to tendons at night and sitting Review of shoulder ex's currently doing: bent row, shoulder extension and shoulder horizontal abduction (currently doing 2x10) Red band vertical rows 20x Red band bil shoulder extension on doorknob 20x Red band bil shoulder extension anchored over the top of the door 20x Neuromuscular re-education to activate scapular retractors and depressors    PATIENT EDUCATION:  Education details: Educated patient on avoiding aggressive stretching and how this could cause increased inflammation.  Person educated: Patient Education method: Explanation Education comprehension:  verbalized understanding, returned demonstration, and verbal cues required     HOME EXERCISE PROGRAM: Access Code: [email or web address] URL: [email or web address] Date: 12/05/2021 Prepared by: Candyce Churn  Program Notes All stretches hold x 30 seconds and do both sides  Exercises - Standing Hamstring Stretch on Chair  - 1 x daily - 7 x weekly - 1 sets - 3 reps - Standing Quad Stretch with Table and Chair Support  - 1 x daily - 7 x weekly - 3 sets - 10 reps - Seated Piriformis Stretch with Trunk Bend  -  1 x daily - 7 x weekly - 3 sets - 10 reps - Supine Figure 4 Piriformis Stretch  - 1 x daily - 7 x weekly - 3 sets - 10 reps - Supine ITB Stretch with Strap  - 1 x daily - 7 x weekly - 3 sets - 10 reps - Supine Hamstring Stretch with Strap  - 1 x daily - 7 x weekly - 3 sets - 10 reps - Supine Posterior Pelvic Tilt  - 1 x daily - 7 x weekly - 1 sets - 20 reps - Supine 90/90 Alternating Heel Touches with Posterior Pelvic Tilt  - 1 x daily - 7 x weekly - 3 sets - 10 reps - Hooklying Isometric Hip Flexion  - 1 x daily - 7 x weekly - 1 sets - 10 reps - Clam with Resistance  - 1 x daily - 7 x weekly - 1 sets - 10 reps - Staggered Stance Gluteal Strengthening  - 1 x daily - 7 x weekly - 3 sets - 10 reps - Supine Transversus Abdominis Bracing with Leg Extension  - 1 x daily - 7 x weekly - 2 sets - 10 reps - Plank with Elbows on Table  - 1 x daily - 7 x weekly - 1 sets - 2 reps - 30 sec hold - Standing Row with Anchored Resistance  - 1 x daily - 7 x weekly - 2 sets - 10 reps - Shoulder extension with resistance - Neutral  - 1 x daily - 7 x weekly - 2 sets - 10 reps - Wall Push Up  - 1 x daily - 7 x weekly - 2 sets - 10 reps - Shoulder External Rotation and Scapular Retraction with Resistance  - 1 x daily - 7 x weekly - 3 sets - 10 reps - Standing Shoulder Horizontal Abduction with Resistance  - 1 x daily - 7 x weekly - 3 sets - 10 reps - Prone Shoulder Extension - Single Arm  - 2 x daily - 7  x weekly - 2 sets - 10 reps - Prone Shoulder Row  - 2 x daily - 7 x weekly - 2 sets - 10 reps - Prone Single Arm Shoulder Horizontal Abduction with Scapular Retraction and Palm Down  - 2 x daily - 7 x weekly - 2 sets - 10 reps - Sidelying Shoulder External Rotation  - 2 x daily - 7 x weekly - 2 sets - 10 reps - Single Arm Serratus Punches in Supine with Dumbbell  - 2 x daily - 7 x weekly - 2 sets - 10 reps - Standing Shoulder Flexion to 90 Degrees with Dumbbells  - 2 x daily - 7 x weekly - 2 sets - 10 reps - Standing Shoulder Scaption  - 2 x daily - 7 x weekly - 2 sets - 10 reps  ASSESSMENT:   CLINICAL IMPRESSION:    The patient is highly motivated and compliant with her HEP.  She is striving to be fully independent with her program and needs some further instruction.  Verbal cues for abdominal brace rather than pelvic tilt with supine core leg lowers.  Single leg lowering vs. Double leg lowering is appropriate challenge level at this stage.  Modified handles help with thumb discomfort with gripping.      OBJECTIVE IMPAIRMENTS decreased mobility, difficulty walking, decreased strength, increased fascial restrictions, increased muscle spasms, impaired flexibility, postural dysfunction, and pain.    ACTIVITY LIMITATIONS cleaning, community activity,  driving, meal prep, occupation, laundry, yard work, and shopping.    PERSONAL FACTORS Age, Fitness, Past/current experiences, and Time since onset of injury/illness/exacerbation are also affecting patient's functional outcome.      REHAB POTENTIAL: Good   CLINICAL DECISION MAKING: Stable/uncomplicated   EVALUATION COMPLEXITY: Low     GOALS: Goals reviewed with patient? Yes   SHORT TERM GOALS: Target date: 11/20/2021    (Remove Blue Hyperlink)   Independence with initial HEP  Baseline: Goal status: MET   2.  Pain report to be no greater than 4/10  Baseline:  Goal status: MET     LONG TERM GOALS: Target date: 01/25/22   Patient to be  independent with advanced HEP  Baseline:  Goal status: In progress    2.  Patient to report pain no greater than 2/10  LBP Baseline:  Goal status: MET   3.  Patient will report a 50% improvement in sleep (shoulders and back) Baseline:  Goal status: In progress   4.  Patient to be able to resume golfing. Baseline:  Goal status:  5. The patient will have grossly 4+/5 to 5-/5 strength in shoulder/scapular muscles needed to move her rolling suitcase on upcoming trip Goal status: New  6. Quick DASH score improved to 12.9 or less   PLAN: PT FREQUENCY: 2x/week   PT DURATION: 8 weeks   PLANNED INTERVENTIONS: Therapeutic exercises, Therapeutic activity, Neuromuscular re-education, Balance training, Gait training, Patient/Family education, Joint mobilization, Stair training, Aquatic Therapy, Dry Needling, Electrical stimulation, Spinal mobilization, Cryotherapy, Moist heat, Taping, Traction, Ultrasound, and Manual therapy.   PLAN FOR NEXT SESSION:  Patient requests one more comprehensive review of HEP;  check progress toward goals  Ruben Im, PT 12/07/21 10:35 AM Phone: [phone] Fax: [phone]  [address] [place] [address] [place], [place] [address] Phone # [phone] Fax [phone]

## 2021-12-14 ENCOUNTER — Ambulatory Visit: Payer: Medicare Other | Attending: Sports Medicine | Admitting: Physical Therapy

## 2021-12-14 DIAGNOSIS — M5459 Other low back pain: Secondary | ICD-10-CM | POA: Insufficient documentation

## 2021-12-14 DIAGNOSIS — M25512 Pain in left shoulder: Secondary | ICD-10-CM | POA: Insufficient documentation

## 2021-12-14 DIAGNOSIS — M6281 Muscle weakness (generalized): Secondary | ICD-10-CM | POA: Insufficient documentation

## 2021-12-14 DIAGNOSIS — M25511 Pain in right shoulder: Secondary | ICD-10-CM | POA: Diagnosis present

## 2021-12-14 DIAGNOSIS — M25551 Pain in right hip: Secondary | ICD-10-CM | POA: Diagnosis present

## 2021-12-14 DIAGNOSIS — G8929 Other chronic pain: Secondary | ICD-10-CM | POA: Diagnosis present

## 2021-12-14 NOTE — Therapy (Signed)
OUTPATIENT PHYSICAL THERAPY TREATMENT NOTE Patient Name: Abigail Pruitt MRN: [id number] DOB:March 03, 1946, 76 y.o., female Today's Date: 12/14/2021  PCP: Fernanda Drum, MD   REFERRING PROVIDER: Christena Deem, MD  END OF SESSION:   PT End of Session - 12/14/21 0927     Visit Number 14    Date for PT Re-Evaluation 01/25/22    Authorization Type Medicare A and B    Progress Note Due on Visit 20    PT Start Time 0927    PT Stop Time 1010    PT Time Calculation (min) 43 min    Activity Tolerance Patient tolerated treatment well             Past Medical History:  Diagnosis Date   Colon polyps    Family hx of colon cancer    Hot flashes    Severe hot flashes   Hyperlipidemia    Osteoarthritis    No past surgical history on file. Patient Active Problem List   Diagnosis Date Noted   Other low back pain 10/23/2021   Pain in right hip 10/23/2021    REFERRING DIAG: M54.50 (ICD-10-CM) - Low back pain, unspecified   THERAPY DIAG:  Other low back pain  Pain in right hip  Chronic right shoulder pain  Chronic left shoulder pain  Muscle weakness (generalized)  PERTINENT HISTORY: see above  PRECAUTIONS: none  SUBJECTIVE:  The shoulders are just no good.  I feel it at night and in the evening when resting.  I don't feel it when I'm busy.  My back is better.  It's about 75% let up.   It's manageable.    PAIN:  Are you having pain?yes: NPRS scale: 0/10 Pain location: right shoulder > left shoulder and upper arm Pain description: discomfort Aggravating factors: sleeping on either side Relieving factors: patches, aspercreme; squeezing shoulder blades  Goal  get ready for trip to [place] 8/30    OBJECTIVE:   DIAGNOSTIC FINDINGS:  None for shoulder  PATIENT SURVEYS:  Quick Dash 15.909   POSTURE: good  UPPER EXTREMITY ROM:   Active ROM Right eval Left eval  Shoulder flexion 157 162  Shoulder extension    Shoulder abduction 170 174  Shoulder  adduction    Shoulder internal rotation T6 T4  Shoulder external rotation 84 78  Elbow flexion    Elbow extension    Wrist flexion    Wrist extension    Wrist ulnar deviation    Wrist radial deviation    Wrist pronation    Wrist supination    (Blank rows = not tested)  UPPER EXTREMITY MMT:  MMT Right eval Left eval  Shoulder flexion 4+ 4+  Shoulder extension    Shoulder abduction 4+ 4+  Shoulder adduction    Shoulder internal rotation 4+ 4+  Shoulder external rotation 4+ 4+  Middle trapezius 4 4  Lower trapezius 4 4  Elbow flexion    Elbow extension    Wrist flexion    Wrist extension    Wrist ulnar deviation    Wrist radial deviation    Wrist pronation    Wrist supination    Grip strength (lbs)    (Blank rows = not tested)  SHOULDER SPECIAL TESTS:  Impingement tests: Hawkins/Kennedy impingement test: negative and Painful arc test: negative    Rotator cuff assessment: Drop arm test: negative, Empty can test: negative, and Infraspinatus test: negative     PALPATION:  No tenderness bicipital groove or subacromial region  DIAGNOSTIC FINDINGS:  None of lumbar spine   PATIENT SURVEYS:  FOTO 61 (goal is 7)  Re-assessment 11/29/19: 69   SCREENING FOR RED FLAGS: Bowel or bladder incontinence: No Spinal tumors: No Cauda equina syndrome: No Compression fracture: No Abdominal aneurysm: No   COGNITION:           Overall cognitive status: Within functional limits for tasks assessed                          SENSATION: WFL   MUSCLE LENGTH: Hamstrings: Right 60 deg; Left 50 deg  Re-assessment 11/23/21: right 65 degrees and left 60 degrees Thomas test: Mild positive bilaterally   POSTURE:  WNL     LUMBAR ROM:    Active  A/PROM  10/23/2021  Flexion WNL  Extension WNL  Right lateral flexion WNL  Left lateral flexion WNL  Right rotation WNL  Left rotation WNL   (Blank rows = not tested)   LE ROM:   WNL bilaterally   LE MMT:   Generally 4 to  4+/5 with exception of hip abduction, hip extension and hip ER which were all 4-/5 bilaterally  Re-assessment 11/29/19: all 4+/5, hip extension improved to 4/5, hip ER and IR now 4+/5   LUMBAR SPECIAL TESTS:  Straight leg raise test: Negative       GAIT: Distance walked: 30 Assistive device utilized: None Level of assistance: Complete Independence Comments: Slightly antalgic but good heel to toe progression   TODAY'S TREATMENT  7/6: Manual therapy soft tissue mobilization to  right upper trap, levator scap, rhomboids, subscapularis, teres, infraspinatus  Trigger Point Dry-Needling  Treatment instructions: Expect mild to moderate muscle soreness. S/S of pneumothorax if dry needled over a lung field, and to seek immediate medical attention should they occur. Patient verbalized understanding of these instructions and education.  Patient Consent Given: Yes Education handout provided: Previously provided Muscles treated: right upper trap, levator scap, rhomboids, subscapularis, teres, infraspinatus  Treatment response/outcome: Decreased trigger point size and number   Moist heat post needling 3 min Therapeutic exercises: instruction in levator scap stretch and rhomboid stretches to compliment manual therapy and DN      6/29: Comprehensive review of HEP  Glute med stand tall ex SLS 10x Given green band with "handles" secondary to thumb pain for rows and extensions Supine serratus punch x 20  with 1 lb Supine abdominal brace with leg lowering 10x each side Standing shoulder flexion 1# 20x Standing shoulder scaption 1# 20x Countertop plank on elbows 3x     11/30/21: UBE x 5 min (2.5 min fwd and 2.5 min reverse) Review of tband exercises for shoulders: standing shoulder ext, rows, bilateral ER and horizontal abd x 20 each (or 2 sets of 10) Added shoulder ext, rows and horizontal abd in prone (towel roll under forehead) x 20 each with 1 lb Added sidelying shoulder ER with 1 lb  x 20 Supine serratus punch x 20  with 1 lb Education on anatomy of the shoulder and avoiding overhead reach     PATIENT EDUCATION:  Education details: Educated patient on avoiding aggressive stretching and how this could cause increased inflammation.  Person educated: Patient Education method: Explanation Education comprehension: verbalized understanding, returned demonstration, and verbal cues required     HOME EXERCISE PROGRAM: Access Code: [email or web address] URL: [email or web address] Date: 12/14/2021 Prepared by: Ruben Im  Program Notes All stretches hold x 30 seconds and do both sides  Exercises - Standing  Hamstring Stretch on Chair  - 1 x daily - 7 x weekly - 1 sets - 3 reps - Standing Quad Stretch with Table and Chair Support  - 1 x daily - 7 x weekly - 3 sets - 10 reps - Seated Piriformis Stretch with Trunk Bend  - 1 x daily - 7 x weekly - 3 sets - 10 reps - Supine Figure 4 Piriformis Stretch  - 1 x daily - 7 x weekly - 3 sets - 10 reps - Supine ITB Stretch with Strap  - 1 x daily - 7 x weekly - 3 sets - 10 reps - Supine Hamstring Stretch with Strap  - 1 x daily - 7 x weekly - 3 sets - 10 reps - Supine Posterior Pelvic Tilt  - 1 x daily - 7 x weekly - 1 sets - 20 reps - Supine 90/90 Alternating Heel Touches with Posterior Pelvic Tilt  - 1 x daily - 7 x weekly - 3 sets - 10 reps - Hooklying Isometric Hip Flexion  - 1 x daily - 7 x weekly - 1 sets - 10 reps - Clam with Resistance  - 1 x daily - 7 x weekly - 1 sets - 10 reps - Staggered Stance Gluteal Strengthening  - 1 x daily - 7 x weekly - 3 sets - 10 reps - Supine Transversus Abdominis Bracing with Leg Extension  - 1 x daily - 7 x weekly - 2 sets - 10 reps - Plank with Elbows on Table  - 1 x daily - 7 x weekly - 1 sets - 2 reps - 30 sec hold - Standing Row with Anchored Resistance  - 1 x daily - 7 x weekly - 2 sets - 10 reps - Shoulder extension with resistance - Neutral  - 1 x daily - 7 x weekly - 2 sets - 10  reps - Wall Push Up  - 1 x daily - 7 x weekly - 2 sets - 10 reps - Shoulder External Rotation and Scapular Retraction with Resistance  - 1 x daily - 7 x weekly - 3 sets - 10 reps - Standing Shoulder Horizontal Abduction with Resistance  - 1 x daily - 7 x weekly - 3 sets - 10 reps - Prone Shoulder Extension - Single Arm  - 2 x daily - 7 x weekly - 2 sets - 10 reps - Prone Shoulder Row  - 2 x daily - 7 x weekly - 2 sets - 10 reps - Prone Single Arm Shoulder Horizontal Abduction with Scapular Retraction and Palm Down  - 2 x daily - 7 x weekly - 2 sets - 10 reps - Sidelying Shoulder External Rotation  - 2 x daily - 7 x weekly - 2 sets - 10 reps - Single Arm Serratus Punches in Supine with Dumbbell  - 2 x daily - 7 x weekly - 2 sets - 10 reps - Standing Shoulder Flexion to 90 Degrees with Dumbbells  - 2 x daily - 7 x weekly - 2 sets - 10 reps - Standing Shoulder Scaption  - 2 x daily - 7 x weekly - 2 sets - 10 reps - Seated Levator Scapulae Stretch (Mirrored)  - 1 x daily - 7 x weekly - 1 sets - 3 reps - 30 hold - Seated Rhomboid Stretch  - 1 x daily - 7 x weekly - 1 sets - 3 reps - 30 hold - Doorway Rhomboid Stretch  - 1 x daily -  7 x weekly - 1 sets - 3 reps - 30 hold ASSESSMENT:   CLINICAL IMPRESSION:    The patient is highly compliant with her HEP but notes a flare up of pain with palpable tender points in levator scap and upper trap muscles.  She had a positive response to DN for her low back and is receptive to doing DN in this region today.  She has multiple twitch responses which is a good prognostic indicator. Added muscle specific stretches to optimize treatment.  Therapist monitoring response to all interventions and modifying treatment accordingly.       OBJECTIVE IMPAIRMENTS decreased mobility, difficulty walking, decreased strength, increased fascial restrictions, increased muscle spasms, impaired flexibility, postural dysfunction, and pain.    ACTIVITY LIMITATIONS cleaning,  community activity, driving, meal prep, occupation, laundry, yard work, and shopping.    PERSONAL FACTORS Age, Fitness, Past/current experiences, and Time since onset of injury/illness/exacerbation are also affecting patient's functional outcome.      REHAB POTENTIAL: Good   CLINICAL DECISION MAKING: Stable/uncomplicated   EVALUATION COMPLEXITY: Low     GOALS: Goals reviewed with patient? Yes   SHORT TERM GOALS: Target date: 11/20/2021    (Remove Blue Hyperlink)   Independence with initial HEP  Baseline: Goal status: MET   2.  Pain report to be no greater than 4/10  Baseline:  Goal status: MET     LONG TERM GOALS: Target date: 01/25/22   Patient to be independent with advanced HEP  Baseline:  Goal status: In progress    2.  Patient to report pain no greater than 2/10  LBP Baseline:  Goal status: MET   3.  Patient will report a 50% improvement in sleep (shoulders and back) Baseline:  Goal status: In progress   4.  Patient to be able to resume golfing. Baseline:  Goal status:  5. The patient will have grossly 4+/5 to 5-/5 strength in shoulder/scapular muscles needed to move her rolling suitcase on upcoming trip Goal status: New  6. Quick DASH score improved to 12.9 or less   PLAN: PT FREQUENCY: 2x/week   PT DURATION: 8 weeks   PLANNED INTERVENTIONS: Therapeutic exercises, Therapeutic activity, Neuromuscular re-education, Balance training, Gait training, Patient/Family education, Joint mobilization, Stair training, Aquatic Therapy, Dry Needling, Electrical stimulation, Spinal mobilization, Cryotherapy, Moist heat, Taping, Traction, Ultrasound, and Manual therapy.   PLAN FOR NEXT SESSION:  assess response to DN scapular region; may need additional DN before extensive upcoming travel; review stretches as needed  Ruben Im, PT 12/14/21 10:15 AM Phone: [phone] Fax: [phone]

## 2021-12-19 ENCOUNTER — Ambulatory Visit: Payer: Medicare Other | Admitting: Physical Therapy

## 2021-12-19 DIAGNOSIS — M5459 Other low back pain: Secondary | ICD-10-CM

## 2021-12-19 DIAGNOSIS — M6281 Muscle weakness (generalized): Secondary | ICD-10-CM

## 2021-12-19 DIAGNOSIS — G8929 Other chronic pain: Secondary | ICD-10-CM

## 2021-12-19 DIAGNOSIS — M25551 Pain in right hip: Secondary | ICD-10-CM

## 2021-12-19 NOTE — Therapy (Addendum)
OUTPATIENT PHYSICAL THERAPY TREATMENT NOTE/DISCHARGE SUMMARY Patient Name: Abigail Pruitt MRN: [id number] DOB:15-Jul-1945, 76 y.o., female Today's Date: 12/19/2021  PCP: Gerald Stabs, MD   REFERRING PROVIDER: Inez Catalina, MD  END OF SESSION:   PT End of Session - 12/19/21 1011     Visit Number 15    Date for PT Re-Evaluation 01/25/22    Authorization Type Medicare A and B  KX now    Progress Note Due on Visit 20    PT Start Time 1014    PT Stop Time 1055    PT Time Calculation (min) 41 min    Activity Tolerance Patient tolerated treatment well             Past Medical History:  Diagnosis Date   Colon polyps    Family hx of colon cancer    Hot flashes    Severe hot flashes   Hyperlipidemia    Osteoarthritis    No past surgical history on file. Patient Active Problem List   Diagnosis Date Noted   Other low back pain 10/23/2021   Pain in right hip 10/23/2021    REFERRING DIAG: M54.50 (ICD-10-CM) - Low back pain, unspecified   THERAPY DIAG:  Other low back pain  Pain in right hip  Chronic right shoulder pain  Chronic left shoulder pain  Muscle weakness (generalized)  PERTINENT HISTORY: see above  PRECAUTIONS: none  SUBJECTIVE:  The DN went well.  Still feels tight inside  (medial) shoulder blades.  Able to sleep on sides.  Did my ex's this morning and that always helps.  Getting a massage on Thursday.  That leg is 85% better.   PAIN:  Are you having pain?yes: NPRS scale: 0/10 Pain location: right shoulder > left shoulder and upper arm Pain description: discomfort Aggravating factors: sleeping on either side Relieving factors: patches, aspercreme; squeezing shoulder blades  Goal  get ready for trip to [place] 8/30    OBJECTIVE:   DIAGNOSTIC FINDINGS:  None for shoulder  PATIENT SURVEYS:  Quick Dash 15.909   POSTURE: good  UPPER EXTREMITY ROM:   Active ROM Right eval Left eval 7/11  Shoulder flexion 157 162 165 right    Shoulder extension     Shoulder abduction 170 174 170  Shoulder adduction     Shoulder internal rotation T6 T4 T6  Shoulder external rotation 84 78   Elbow flexion     Elbow extension     Wrist flexion     Wrist extension     Wrist ulnar deviation     Wrist radial deviation     Wrist pronation     Wrist supination     (Blank rows = not tested)  UPPER EXTREMITY MMT:  MMT Right eval Left eval 7/11  Shoulder flexion 4+ 4+ 4+  Shoulder extension     Shoulder abduction 4+ 4+ 4+  Shoulder adduction     Shoulder internal rotation 4+ 4+ 4+  Shoulder external rotation 4+ 4+ 4+  Middle trapezius 4 4 4+  Lower trapezius 4 4 4+  Elbow flexion     Elbow extension     Wrist flexion     Wrist extension     Wrist ulnar deviation     Wrist radial deviation     Wrist pronation     Wrist supination     Grip strength (lbs)     (Blank rows = not tested)  SHOULDER SPECIAL TESTS:  Impingement tests: Hawkins/Kennedy impingement test: negative  and Painful arc test: negative    Rotator cuff assessment: Drop arm test: negative, Empty can test: negative, and Infraspinatus test: negative     PALPATION:  No tenderness bicipital groove or subacromial region     DIAGNOSTIC FINDINGS:  None of lumbar spine   PATIENT SURVEYS:  FOTO 61 (goal is 67)  Re-assessment 11/29/19: 69   SCREENING FOR RED FLAGS: Bowel or bladder incontinence: No Spinal tumors: No Cauda equina syndrome: No Compression fracture: No Abdominal aneurysm: No   COGNITION:           Overall cognitive status: Within functional limits for tasks assessed                          SENSATION: WFL   MUSCLE LENGTH: Hamstrings: Right 60 deg; Left 50 deg  Re-assessment 11/23/21: right 65 degrees and left 60 degrees Thomas test: Mild positive bilaterally   POSTURE:  WNL     LUMBAR ROM:    Active  A/PROM  10/23/2021  Flexion WNL  Extension WNL  Right lateral flexion WNL  Left lateral flexion WNL  Right rotation  WNL  Left rotation WNL   (Blank rows = not tested)   LE ROM:   WNL bilaterally   LE MMT:   Generally 4 to 4+/5 with exception of hip abduction, hip extension and hip ER which were all 4-/5 bilaterally  Re-assessment 11/29/19: all 4+/5, hip extension improved to 4/5, hip ER and IR now 4+/5   LUMBAR SPECIAL TESTS:  Straight leg raise test: Negative       GAIT: Distance walked: 30 Assistive device utilized: None Level of assistance: Complete Independence Comments: Slightly antalgic but good heel to toe progression   TODAY'S TREATMENT  7/11: Manual therapy soft tissue mobilization to  right and left upper trap, levator scap, rhomboids, subscapularis, teres, infraspinatus  Trigger Point Dry-Needling  Treatment instructions: Expect mild to moderate muscle soreness. S/S of pneumothorax if dry needled over a lung field, and to seek immediate medical attention should they occur. Patient verbalized understanding of these instructions and education.  Patient Consent Given: Yes Education handout provided: Previously provided Muscles treated: left and right upper trap, levator scap, rhomboids, subscapularis, teres, infraspinatus  Treatment response/outcome: Decreased trigger point size and number   Moist heat post needling 3 min Therapeutic exercises: instruction in seated rhomboid stretches/posterior capsule stretch to compliment manual therapy and DN Discussion on frequency of back HEP performance and shoulder HEP           7/6: Manual therapy soft tissue mobilization to  right upper trap, levator scap, rhomboids, subscapularis, teres, infraspinatus  Trigger Point Dry-Needling  Treatment instructions: Expect mild to moderate muscle soreness. S/S of pneumothorax if dry needled over a lung field, and to seek immediate medical attention should they occur. Patient verbalized understanding of these instructions and education.  Patient Consent Given: Yes Education handout  provided: Previously provided Muscles treated: right upper trap, levator scap, rhomboids, subscapularis, teres, infraspinatus  Treatment response/outcome: Decreased trigger point size and number   Moist heat post needling 3 min Therapeutic exercises: instruction in levator scap stretch and rhomboid stretches to compliment manual therapy and DN       PATIENT EDUCATION:  Education details: Educated patient on avoiding aggressive stretching and how this could cause increased inflammation.  Person educated: Patient Education method: Explanation Education comprehension: verbalized understanding, returned demonstration, and verbal cues required     HOME EXERCISE PROGRAM: Access Code:  [email or web address] URL: [email or web address] Date: 12/14/2021 Prepared by: Ruben Im  Program Notes All stretches hold x 30 seconds and do both sides  Exercises - Standing Hamstring Stretch on Chair  - 1 x daily - 7 x weekly - 1 sets - 3 reps - Standing Quad Stretch with Table and Chair Support  - 1 x daily - 7 x weekly - 3 sets - 10 reps - Seated Piriformis Stretch with Trunk Bend  - 1 x daily - 7 x weekly - 3 sets - 10 reps - Supine Figure 4 Piriformis Stretch  - 1 x daily - 7 x weekly - 3 sets - 10 reps - Supine ITB Stretch with Strap  - 1 x daily - 7 x weekly - 3 sets - 10 reps - Supine Hamstring Stretch with Strap  - 1 x daily - 7 x weekly - 3 sets - 10 reps - Supine Posterior Pelvic Tilt  - 1 x daily - 7 x weekly - 1 sets - 20 reps - Supine 90/90 Alternating Heel Touches with Posterior Pelvic Tilt  - 1 x daily - 7 x weekly - 3 sets - 10 reps - Hooklying Isometric Hip Flexion  - 1 x daily - 7 x weekly - 1 sets - 10 reps - Clam with Resistance  - 1 x daily - 7 x weekly - 1 sets - 10 reps - Staggered Stance Gluteal Strengthening  - 1 x daily - 7 x weekly - 3 sets - 10 reps - Supine Transversus Abdominis Bracing with Leg Extension  - 1 x daily - 7 x weekly - 2 sets - 10 reps - Plank with  Elbows on Table  - 1 x daily - 7 x weekly - 1 sets - 2 reps - 30 sec hold - Standing Row with Anchored Resistance  - 1 x daily - 7 x weekly - 2 sets - 10 reps - Shoulder extension with resistance - Neutral  - 1 x daily - 7 x weekly - 2 sets - 10 reps - Wall Push Up  - 1 x daily - 7 x weekly - 2 sets - 10 reps - Shoulder External Rotation and Scapular Retraction with Resistance  - 1 x daily - 7 x weekly - 3 sets - 10 reps - Standing Shoulder Horizontal Abduction with Resistance  - 1 x daily - 7 x weekly - 3 sets - 10 reps - Prone Shoulder Extension - Single Arm  - 2 x daily - 7 x weekly - 2 sets - 10 reps - Prone Shoulder Row  - 2 x daily - 7 x weekly - 2 sets - 10 reps - Prone Single Arm Shoulder Horizontal Abduction with Scapular Retraction and Palm Down  - 2 x daily - 7 x weekly - 2 sets - 10 reps - Sidelying Shoulder External Rotation  - 2 x daily - 7 x weekly - 2 sets - 10 reps - Single Arm Serratus Punches in Supine with Dumbbell  - 2 x daily - 7 x weekly - 2 sets - 10 reps - Standing Shoulder Flexion to 90 Degrees with Dumbbells  - 2 x daily - 7 x weekly - 2 sets - 10 reps - Standing Shoulder Scaption  - 2 x daily - 7 x weekly - 2 sets - 10 reps - Seated Levator Scapulae Stretch (Mirrored)  - 1 x daily - 7 x weekly - 1 sets - 3 reps - 30 hold - Seated Rhomboid Stretch  -  1 x daily - 7 x weekly - 1 sets - 3 reps - 30 hold - Doorway Rhomboid Stretch  - 1 x daily - 7 x weekly - 1 sets - 3 reps - 30 hold ASSESSMENT:   CLINICAL IMPRESSION:    The patient reports a positive response to DN and manual therapy with decreased overall size and number of tender points in glenohumeral and scapular muscles.  Included left side as well today to address pain along the medial border of the scapula.  She will be traveling extensively over the next few weeks and hopes to be feeling as good as possible.  She is highly compliant with her HEP.     OBJECTIVE IMPAIRMENTS decreased mobility, difficulty walking,  decreased strength, increased fascial restrictions, increased muscle spasms, impaired flexibility, postural dysfunction, and pain.    ACTIVITY LIMITATIONS cleaning, community activity, driving, meal prep, occupation, laundry, yard work, and shopping.    PERSONAL FACTORS Age, Fitness, Past/current experiences, and Time since onset of injury/illness/exacerbation are also affecting patient's functional outcome.      REHAB POTENTIAL: Good   CLINICAL DECISION MAKING: Stable/uncomplicated   EVALUATION COMPLEXITY: Low     GOALS: Goals reviewed with patient? Yes   SHORT TERM GOALS: Target date: 11/20/2021    (Remove Blue Hyperlink)   Independence with initial HEP  Baseline: Goal status: MET   2.  Pain report to be no greater than 4/10  Baseline:  Goal status: MET     LONG TERM GOALS: Target date: 01/25/22   Patient to be independent with advanced HEP  Baseline:  Goal status: In progress    2.  Patient to report pain no greater than 2/10  LBP Baseline:  Goal status: MET   3.  Patient will report a 50% improvement in sleep (shoulders and back) Baseline:  Goal status: In progress   4.  Patient to be able to resume golfing. Baseline:  Goal status:  5. The patient will have grossly 4+/5 to 5-/5 strength in shoulder/scapular muscles needed to move her rolling suitcase on upcoming trip Goal status: New  6. Quick DASH score improved to 12.9 or less   PLAN: PT FREQUENCY: 2x/week   PT DURATION: 8 weeks   PLANNED INTERVENTIONS: Therapeutic exercises, Therapeutic activity, Neuromuscular re-education, Balance training, Gait training, Patient/Family education, Joint mobilization, Stair training, Aquatic Therapy, Dry Needling, Electrical stimulation, Spinal mobilization, Cryotherapy, Moist heat, Taping, Traction, Ultrasound, and Manual therapy.   PLAN FOR NEXT SESSION: KX now; recheck Quick DASH;  assess response to DN scapular region; may need additional DN before extensive upcoming  travel; review stretches as needed  Ruben Im, PT 12/19/21 6:10 PM Phone: [phone] Fax: [phone]    PHYSICAL THERAPY DISCHARGE SUMMARY  Visits from Start of Care: 15  Current functional level related to goals / functional outcomes: The facility cancelled last scheduled visit secondary to provider call-out.  She reported she was doing well and may not need anymore PT.  She agreed to call and make an appt before 8/17 (end of plan of care) if further treatment was needed.  She has not called to schedule.     Remaining deficits: As above   Education / Equipment: HEP   Patient agrees to discharge. Patient goals were partially met. Patient is being discharged due to the patient's request.  Ruben Im, PT 01/30/22 12:47 PM Phone: [phone] Fax: [phone]

## 2021-12-21 ENCOUNTER — Ambulatory Visit: Payer: Medicare Other | Admitting: Physical Therapy

## 2022-01-04 ENCOUNTER — Encounter: Payer: PRIVATE HEALTH INSURANCE | Admitting: Physical Therapy

## 2022-01-09 ENCOUNTER — Ambulatory Visit: Payer: Medicare Other | Admitting: Physical Therapy

## 2022-01-11 ENCOUNTER — Encounter: Payer: PRIVATE HEALTH INSURANCE | Admitting: Physical Therapy

## 2022-02-16 ENCOUNTER — Other Ambulatory Visit (HOSPITAL_BASED_OUTPATIENT_CLINIC_OR_DEPARTMENT_OTHER): Payer: Self-pay | Admitting: Family Medicine

## 2022-02-16 DIAGNOSIS — R102 Pelvic and perineal pain: Secondary | ICD-10-CM

## 2022-02-16 DIAGNOSIS — N390 Urinary tract infection, site not specified: Secondary | ICD-10-CM

## 2022-02-17 ENCOUNTER — Ambulatory Visit (HOSPITAL_BASED_OUTPATIENT_CLINIC_OR_DEPARTMENT_OTHER)
Admission: RE | Admit: 2022-02-17 | Discharge: 2022-02-17 | Disposition: A | Discharge status: Home / Self Care | Payer: Medicare Other | Source: Ambulatory Visit | Attending: Family Medicine | Admitting: Family Medicine

## 2022-02-17 DIAGNOSIS — N390 Urinary tract infection, site not specified: Secondary | ICD-10-CM | POA: Diagnosis present

## 2022-02-17 DIAGNOSIS — R102 Pelvic and perineal pain: Secondary | ICD-10-CM | POA: Diagnosis present

## 2022-02-17 MED ORDER — IOHEXOL 300 MG/ML  SOLN
100.0000 mL | Freq: Once | INTRAMUSCULAR | Status: AC | PRN
  Administered 2022-02-17: 100 mL via INTRAVENOUS

## 2022-03-12 ENCOUNTER — Other Ambulatory Visit (HOSPITAL_BASED_OUTPATIENT_CLINIC_OR_DEPARTMENT_OTHER): Payer: Self-pay

## 2022-03-12 MED ORDER — INFLUENZA VAC A&B SA ADJ QUAD 0.5 ML IM PRSY
PREFILLED_SYRINGE | INTRAMUSCULAR | 0 refills | Status: AC
  Filled 2022-03-12: qty 0.5, 1d supply, fill #0

## 2022-03-27 ENCOUNTER — Other Ambulatory Visit (HOSPITAL_BASED_OUTPATIENT_CLINIC_OR_DEPARTMENT_OTHER): Payer: Self-pay

## 2022-03-27 MED ORDER — AREXVY 120 MCG/0.5ML IM SUSR
INTRAMUSCULAR | 0 refills | Status: AC
  Filled 2022-03-27: qty 0.5, 1d supply, fill #0

## 2022-10-24 ENCOUNTER — Other Ambulatory Visit: Payer: Self-pay

## 2022-10-24 ENCOUNTER — Ambulatory Visit: Payer: Medicare Other | Attending: Sports Medicine | Admitting: Physical Therapy

## 2022-10-24 ENCOUNTER — Encounter: Payer: Self-pay | Admitting: Physical Therapy

## 2022-10-24 DIAGNOSIS — R252 Cramp and spasm: Secondary | ICD-10-CM | POA: Diagnosis present

## 2022-10-24 DIAGNOSIS — M25561 Pain in right knee: Secondary | ICD-10-CM | POA: Insufficient documentation

## 2022-10-24 DIAGNOSIS — G8929 Other chronic pain: Secondary | ICD-10-CM

## 2022-10-24 DIAGNOSIS — M6281 Muscle weakness (generalized): Secondary | ICD-10-CM | POA: Diagnosis present

## 2022-10-24 DIAGNOSIS — M25551 Pain in right hip: Secondary | ICD-10-CM

## 2022-10-24 DIAGNOSIS — R262 Difficulty in walking, not elsewhere classified: Secondary | ICD-10-CM | POA: Insufficient documentation

## 2022-10-24 DIAGNOSIS — M5459 Other low back pain: Secondary | ICD-10-CM

## 2022-10-24 NOTE — Therapy (Signed)
OUTPATIENT PHYSICAL THERAPY HIP EVALUATION   Patient Name: Abigail Pruitt MRN: [id number] DOB:1946-04-14, 77 y.o., female Today's Date: 10/24/2022  END OF SESSION:  PT End of Session - 10/24/22 1339     Visit Number 1    Date for PT Re-Evaluation 12/19/22    Authorization Type Medicare needs KX at visit 15    Progress Note Due on Visit 10    PT Start Time 0845    PT Stop Time 0930    PT Time Calculation (min) 45 min    Activity Tolerance Patient tolerated treatment well    Behavior During Therapy WFL for tasks assessed/performed             Past Medical History:  Diagnosis Date   Colon polyps    Family hx of colon cancer    Hot flashes    Severe hot flashes   Hyperlipidemia    Osteoarthritis    History reviewed. No pertinent surgical history. Patient Active Problem List   Diagnosis Date Noted   Other low back pain 10/23/2021   Pain in right hip 10/23/2021      REFERRING PROVIDER: Christena Deem, MD  REFERRING DIAG: [phone] (ICD-10-CM) - Pain in right hip  Rationale for Evaluation and Treatment: Rehabilitation  THERAPY DIAG:  Pain in right hip  Muscle weakness (generalized)  Other low back pain  Chronic pain of right knee  ONSET DATE: winter 2023  SUBJECTIVE:                                                                                                                                                                                           SUBJECTIVE STATEMENT: "My skeleton is wearing out."  I have spinal stenosis, I have had L3 and L5 injections recently and it hasn't helped.  In the past, lumbar injections helped my hip but this time it didn't. MRI of Rt hip show some tears.  MD wants me to get stronger to see if it will help. I get pain into my Rt knee and sometimes into my foot.   I really hope to get more DN again b/c it helped me so much last time.  PERTINENT HISTORY:  Had PT at this clinic in 2023 History of spinal stenosis Plays  golf Husband has stage 4 cancer - diagnosed last year after finished PT  PAIN:  PAIN:  Are you having pain? Yes NPRS scale: 10/10 at worst, 3/10 at best Pain location: lumbar, Rt hip to Rt knee and sometimes to ankle Pain orientation: Right  PAIN TYPE: aching, dull, tight, and burning, travels to knee and lateral calf Pain description:  constant  Aggravating factors: at night, can't sleep on Rt side, being too still (sit too long) Relieving factors: staying active/moving, migun bed (38 min massage bed with heated balls), Voltaren, Aleve/Tylenol   PRECAUTIONS: None  WEIGHT BEARING RESTRICTIONS: No  FALLS:  Has patient fallen in last 6 months? No  LIVING ENVIRONMENT: Lives with: lives with their spouse Lives in: House/apartment Stairs: No Has following equipment at home: None  OCCUPATION: retired  PLOF: Independent  PATIENT GOALS: get stronger, get relief, play golf again, sleep better  NEXT MD VISIT: next month   OBJECTIVE:   DIAGNOSTIC FINDINGS:  No imaging available in chart but Pt reports history of spinal stenosis Recent MRI of Rt hip and lumbar - showed tears of Rt hip - Pt will bring reports next time  PATIENT SURVEYS:  FOTO 58, goal 61  SCREENING FOR RED FLAGS: Bowel or bladder incontinence: No Spinal tumors: No Cauda equina syndrome: No Compression fracture: No Abdominal aneurysm: No  COGNITION: Overall cognitive status: Within functional limits for tasks assessed     SENSATION: Rt ankle tingly if travels down that far  MUSCLE LENGTH: Limited quad length bil by 30%  POSTURE: decreased lumbar lordosis and decreased thoracic kyphosis  PALPATION: Rt tight and tender: TFL and ITB, vastus lateralis, tight hamstring, glut min and piriformis Atrophy present through Rt lateral hip Tight lumbar multifidi bil   LUMBAR ROM:  Full trunk ROM without pain  LOWER EXTREMITY ROM:    Full ROM bil throughout   LOWER EXTREMITY MMT:    MMT Right eval  Left eval  Hip flexion 4+ 5  Hip extension    Hip abduction 3+ 4+  Hip adduction 5 5  Hip internal rotation 5 5  Hip external rotation 4- 5  Knee flexion 5 5  Knee extension 5 5  Ankle dorsiflexion 5 5  Ankle plantarflexion    Ankle inversion    Ankle eversion     (Blank rows = not tested)  KNEE SPECIAL TESTS:  Laxity present Rt knee with increased neutral zone + Ober's Test on Rt  FUNCTIONAL TESTS:  SLS much more wobbly on Rt side but able to balance   GAIT: Distance walked: within clinic Assistive device utilized: None Level of assistance: Complete Independence Comments: lacks arm swing and trunk rotation, Pt states will hobble if have been sitting too long  TODAY'S TREATMENT:                                                                                                                              DATE:  10/24/22  Initiated HEP    PATIENT EDUCATION:  Education details: [email or web address] Person educated: Patient Education method: Programmer, multimedia, Facilities manager, Verbal cues, and Handouts Education comprehension: verbalized understanding and returned demonstration  HOME EXERCISE PROGRAM: Access Code: [email or web address] URL: [email or web address] Date: 10/24/2022 Prepared by: Loistine Simas Yesenia Locurto  Exercises - Supine Figure 4 Piriformis Stretch  - 1 x daily - 7 x weekly -  1 sets - 2-3 reps - 20-30 hold - Modified Thomas Stretch  - 1 x daily - 7 x weekly - 1 sets - 2-3 reps - 20-30 hold - Supine ITB Stretch  - 1 x daily - 7 x weekly - 1 sets - 2-3 reps - 20-30 hold  ASSESSMENT:  CLINICAL IMPRESSION: Patient is a 77 y.o. female who was seen today for physical therapy evaluation and treatment for Rt hip pain.  Pt has history of LBP and Rt hip pain which can extend to Rt knee and sometimes to ankle.  Pain is worse at night laying on Rt side or sitting too long.  She does better with movement.  She has excellent mobility overall with trunk and bil hip ROM WNL and painfree on  assessment.  She has weakness in Rt lateral and posterior hip.  She has signif tension in bil quads and ITB with + Claiborne Rigg and [employer] tests.  Rt knee appears to have some laxity surrounding the joint (history of injury many years ago) and contributes to weakness and poor balance in SLS on Rt.   Pt with signif trigger points present in lumbar multifidi, gluteals, piriformis, glut min/med, TFL and Rt thigh (VL, hamstring, ITB).  Pt has done very well with DN at this clinic in the past and hopes for similar treatment.   She is active and gardens and plays golf.    OBJECTIVE IMPAIRMENTS: decreased activity tolerance, decreased balance, decreased strength, hypomobility, increased fascial restrictions, increased muscle spasms, impaired flexibility, impaired tone, improper body mechanics, postural dysfunction, and pain.   ACTIVITY LIMITATIONS: sitting, squatting, sleeping, stairs, and transfers  PARTICIPATION LIMITATIONS: cleaning, laundry, shopping, community activity, and yard work  PERSONAL FACTORS: Time since onset of injury/illness/exacerbation are also affecting patient's functional outcome.   REHAB POTENTIAL: Excellent  CLINICAL DECISION MAKING: Stable/uncomplicated  EVALUATION COMPLEXITY: Low   GOALS: Goals reviewed with patient? Yes  SHORT TERM GOALS: Target date: 11/21/22  Pt will be ind with initial HEP without exacerbation of symptoms. Baseline: Goal status: INITIAL  2.  Pt will improve quad and ITB length to WNL and have negative Claiborne Rigg and Thomas test Baseline:  Goal status: INITIAL   LONG TERM GOALS: Target date: 12/19/22  Pt will be ind with advanced HEP and understand how to safely progress recreational activities. Baseline:  Goal status: INITIAL  2.  Pt will improve Rt hip and core strength to at least 4+/5 to improve functional strength for daily tasks and recreational activities.  Baseline:  Goal status: INITIAL  3.  Pt will be able to bend, lift and squat to allow her  to garden. Baseline:  Goal status: INITIAL  4.  Pt will be able to sleep with min pain disruption Baseline:  Goal status: INITIAL  5.  Pt will be able to play golf with min or no pain. Baseline:  Goal status: INITIAL    PLAN:  PT FREQUENCY: 2x/week  PT DURATION: 8 weeks  PLANNED INTERVENTIONS: Therapeutic exercises, Therapeutic activity, Neuromuscular re-education, Balance training, Gait training, Patient/Family education, Self Care, Joint mobilization, Dry Needling, Electrical stimulation, Spinal mobilization, Cryotherapy, Moist heat, Taping, Ionotophoresis 4mg /ml Dexamethasone, and Manual therapy.  PLAN FOR NEXT SESSION: review HEP and DN lumbar multifidi, Rt hip and quad - Pt had success with DN last year for same pain.  Progress core and Rt hip strength, closed chain control around Rt knee (laxity present from past injury), functional strength and mobility for gardening and golf   Morton Peters, PT  10/24/22 1:53 PM

## 2022-10-30 ENCOUNTER — Ambulatory Visit: Payer: Medicare Other

## 2022-11-07 ENCOUNTER — Ambulatory Visit: Payer: Medicare Other | Admitting: Physical Therapy

## 2022-11-07 ENCOUNTER — Encounter: Payer: Self-pay | Admitting: Physical Therapy

## 2022-11-07 DIAGNOSIS — M25551 Pain in right hip: Secondary | ICD-10-CM

## 2022-11-07 DIAGNOSIS — G8929 Other chronic pain: Secondary | ICD-10-CM

## 2022-11-07 DIAGNOSIS — M5459 Other low back pain: Secondary | ICD-10-CM

## 2022-11-07 DIAGNOSIS — M6281 Muscle weakness (generalized): Secondary | ICD-10-CM

## 2022-11-07 NOTE — Therapy (Signed)
OUTPATIENT PHYSICAL THERAPY TREATMENT NOTE   Patient Name: Abigail Pruitt MRN: [id number] DOB:[birth year], 77 y.o., female Today's Date: 11/07/2022  END OF SESSION:  PT End of Session - 11/07/22 0924     Visit Number 2    Date for PT Re-Evaluation 12/19/22    Authorization Type Medicare needs KX at visit 15    Progress Note Due on Visit 10    PT Start Time 0925    PT Stop Time 1018    PT Time Calculation (min) 53 min    Activity Tolerance Patient tolerated treatment well    Behavior During Therapy [facility] for tasks assessed/performed              Past Medical History:  Diagnosis Date   Colon polyps    Family hx of colon cancer    Hot flashes    Severe hot flashes   Hyperlipidemia    Osteoarthritis    History reviewed. No pertinent surgical history. Patient Active Problem List   Diagnosis Date Noted   Other low back pain 10/23/2021   Pain in right hip 10/23/2021      REFERRING PROVIDER: Christena Deem, MD  REFERRING DIAG: [phone] (ICD-10-CM) - Pain in right hip  Rationale for Evaluation and Treatment: Rehabilitation  THERAPY DIAG:  Pain in right hip  Muscle weakness (generalized)  Other low back pain  Chronic pain of right knee  ONSET DATE: winter 2023  SUBJECTIVE:                                                                                                                                                                                           SUBJECTIVE STATEMENT: I had covid early last week so this is my first treatment visit back.  I have been stretching the leg off the side of the bed and it has helped my hip.  I am only a 2-3/10 today.   Eval: "My skeleton is wearing out."  I have spinal stenosis, I have had L3 and L5 injections recently and it hasn't helped.  In the past, lumbar injections helped my hip but this time it didn't. MRI of Rt hip show some tears.  MD wants me to get stronger to see if it will help. I get pain into my Rt knee and  sometimes into my foot.   I really hope to get more DN again b/c it helped me so much last time.  PERTINENT HISTORY:  Had PT at this clinic in 2023 History of spinal stenosis Plays golf Husband has stage 4 cancer - diagnosed last year after finished PT  PAIN:  PAIN:  Are you having pain? Yes NPRS scale: today 2-3/10, 10/10 at worst, 3/10 at best Pain location: lumbar, Rt hip to Rt knee and sometimes to ankle Pain orientation: Right  PAIN TYPE: aching, dull, tight, and burning, travels to knee and lateral calf Pain description: constant  Aggravating factors: at night, can't sleep on Rt side, being too still (sit too long) Relieving factors: staying active/moving, migun bed (38 min massage bed with heated balls), Voltaren, Aleve/Tylenol   PRECAUTIONS: None  WEIGHT BEARING RESTRICTIONS: No  FALLS:  Has patient fallen in last 6 months? No  LIVING ENVIRONMENT: Lives with: lives with their spouse Lives in: House/apartment Stairs: No Has following equipment at home: None  OCCUPATION: retired  PLOF: Independent  PATIENT GOALS: get stronger, get relief, play golf again, sleep better  NEXT MD VISIT: next month   OBJECTIVE:   DIAGNOSTIC FINDINGS:  No imaging available in chart but Pt reports history of spinal stenosis Recent MRI of Rt hip and lumbar - showed tears of Rt hip - Pt will bring reports next time  PATIENT SURVEYS:  FOTO 58, goal 61  SCREENING FOR RED FLAGS: Bowel or bladder incontinence: No Spinal tumors: No Cauda equina syndrome: No Compression fracture: No Abdominal aneurysm: No  COGNITION: Overall cognitive status: Within functional limits for tasks assessed     SENSATION: Rt ankle tingly if travels down that far  MUSCLE LENGTH: Limited quad length bil by 30%  POSTURE: decreased lumbar lordosis and decreased thoracic kyphosis  PALPATION: Rt tight and tender: TFL and ITB, vastus lateralis, tight hamstring, glut min and piriformis Atrophy  present through Rt lateral hip Tight lumbar multifidi bil   LUMBAR ROM:  Full trunk ROM without pain  LOWER EXTREMITY ROM:    Full ROM bil throughout   LOWER EXTREMITY MMT:    MMT Right eval Left eval  Hip flexion 4+ 5  Hip extension    Hip abduction 3+ 4+  Hip adduction 5 5  Hip internal rotation 5 5  Hip external rotation 4- 5  Knee flexion 5 5  Knee extension 5 5  Ankle dorsiflexion 5 5  Ankle plantarflexion    Ankle inversion    Ankle eversion     (Blank rows = not tested)  KNEE SPECIAL TESTS:  Laxity present Rt knee with increased neutral zone + Ober's Test on Rt  FUNCTIONAL TESTS:  SLS much more wobbly on Rt side but able to balance   GAIT: Distance walked: within clinic Assistive device utilized: None Level of assistance: Complete Independence Comments: lacks arm swing and trunk rotation, Pt states will hobble if have been sitting too long  TODAY'S TREATMENT:                                                                                                                              DATE:  11/07/22 NuStep L4 x 4' PT present to review status Rt hip flexor stretch off edge of table -  add active knee flexion for quad bias x 60" Supine hooklying Rt ITB legs crossed stretch x60"  Supine fig 4 Rt stretch in the air with self-overpressure x60" Supine bridge 8x 5" holds Supine SLR Rt x10 SL clam tied green tband x10 SL Rt hip abduction x 10 Standing hip extension x10 bil HEP updated Attempted squat to chair but Pt uses all knees - will work on this at future appt Trigger Point Dry-Needling  Treatment instructions: Expect mild to moderate muscle soreness. S/S of pneumothorax if dry needled over a lung field, and to seek immediate medical attention should they occur. Patient verbalized understanding of these instructions and education.  Patient Consent Given: Yes Education handout provided: Previously provided Muscles treated: bil lumbar multifidi marinating  technique, Rt TFL, glut min, glut med, piriformis Electrical stimulation performed: No Parameters: N/A Treatment response/outcome: signif twitch and release Rt piriformis, tight spasm Rt TFL and glut min, deep ache and release lumbar spine Aftercare from DN re-iterated   10/24/22  Initiated HEP    PATIENT EDUCATION:  Education details: [email or web address] Person educated: Patient Education method: Programmer, multimedia, Facilities manager, Verbal cues, and Handouts Education comprehension: verbalized understanding and returned demonstration  HOME EXERCISE PROGRAM: Access Code: [email or web address] URL: [email or web address] Date: 11/07/2022 Prepared by: Loistine Simas Witt Plitt  Exercises - Supine Figure 4 Piriformis Stretch  - 1 x daily - 7 x weekly - 1 sets - 2-3 reps - 20-30 hold - Modified Thomas Stretch  - 1 x daily - 7 x weekly - 1 sets - 2-3 reps - 20-30 hold - Supine ITB Stretch  - 1 x daily - 7 x weekly - 1 sets - 2-3 reps - 20-30 hold - Supine Bridge  - 1 x daily - 7 x weekly - 1 sets - 10 reps - Straight Leg Raise  - 1 x daily - 7 x weekly - 1 sets - 10 reps - Clamshell with Resistance  - 1 x daily - 7 x weekly - 1 sets - 10 reps - Sidelying Hip Abduction  - 1 x daily - 7 x weekly - 1 sets - 10 reps - Standing Hip Extension with Counter Support  - 1 x daily - 7 x weekly - 1 sets - 10 reps  ASSESSMENT:  CLINICAL IMPRESSION: Pt had covid early last week so presented today for first treatment.  She has been compliant with hip flexor stretch off edge of bed which demos much improved mobility today, so added knee flexion bias to progress to more quad stretch.  PT added Rt hip strength today with good tolerance.  HEP progression given for strengthening.  Pt will need more training for squat mechanics to encourage hip hinge and avoid excessive knee bending for squat before adding to HEP.  TPDN performed today to lumbar and Rt hip with good twitch/deep ache and release.  Pt reported feeling much looser end of  session.    OBJECTIVE IMPAIRMENTS: decreased activity tolerance, decreased balance, decreased strength, hypomobility, increased fascial restrictions, increased muscle spasms, impaired flexibility, impaired tone, improper body mechanics, postural dysfunction, and pain.   ACTIVITY LIMITATIONS: sitting, squatting, sleeping, stairs, and transfers  PARTICIPATION LIMITATIONS: cleaning, laundry, shopping, community activity, and yard work  PERSONAL FACTORS: Time since onset of injury/illness/exacerbation are also affecting patient's functional outcome.   REHAB POTENTIAL: Excellent  CLINICAL DECISION MAKING: Stable/uncomplicated  EVALUATION COMPLEXITY: Low   GOALS: Goals reviewed with patient? Yes  SHORT TERM GOALS: Target date: 11/21/22  Pt will be ind with initial HEP without exacerbation  of symptoms. Baseline: Goal status: ongoing  2.  Pt will improve quad and ITB length to WNL and have negative Claiborne Rigg and Maisie Fus test Baseline:  Goal status: ongoing - improved Maisie Fus test already - changed to add knee flexion for quad bias 5/29   LONG TERM GOALS: Target date: 12/19/22  Pt will be ind with advanced HEP and understand how to safely progress recreational activities. Baseline:  Goal status: INITIAL  2.  Pt will improve Rt hip and core strength to at least 4+/5 to improve functional strength for daily tasks and recreational activities.  Baseline:  Goal status: INITIAL  3.  Pt will be able to bend, lift and squat to allow her to garden. Baseline:  Goal status: INITIAL  4.  Pt will be able to sleep with min pain disruption Baseline:  Goal status: INITIAL  5.  Pt will be able to play golf with min or no pain. Baseline:  Goal status: INITIAL    PLAN:  PT FREQUENCY: 2x/week  PT DURATION: 8 weeks  PLANNED INTERVENTIONS: Therapeutic exercises, Therapeutic activity, Neuromuscular re-education, Balance training, Gait training, Patient/Family education, Self Care, Joint  mobilization, Dry Needling, Electrical stimulation, Spinal mobilization, Cryotherapy, Moist heat, Taping, Ionotophoresis 4mg /ml Dexamethasone, and Manual therapy.  PLAN FOR NEXT SESSION: work on squat mechanics and hip hinge, review HEP and progress as needed, f/u on DN lumbar multifidi, Rt hip - add Rt thigh lateral quad DN next time - Pt had success with DN last year for same pain.  Progress core and Rt hip strength, closed chain control around Rt knee (laxity present from past injury), functional strength and mobility for gardening and golf  Demari Gales, PT 11/07/22 10:24 AM

## 2022-11-09 ENCOUNTER — Ambulatory Visit: Payer: Medicare Other

## 2022-11-09 DIAGNOSIS — R262 Difficulty in walking, not elsewhere classified: Secondary | ICD-10-CM

## 2022-11-09 DIAGNOSIS — M25551 Pain in right hip: Secondary | ICD-10-CM

## 2022-11-09 DIAGNOSIS — M6281 Muscle weakness (generalized): Secondary | ICD-10-CM

## 2022-11-09 DIAGNOSIS — R252 Cramp and spasm: Secondary | ICD-10-CM

## 2022-11-09 NOTE — Therapy (Signed)
OUTPATIENT PHYSICAL THERAPY TREATMENT NOTE   Patient Name: Abigail Pruitt MRN: [id number] DOB:Jun 14, 1945, 77 y.o., female Today's Date: 11/09/2022  END OF SESSION:  PT End of Session - 11/09/22 0802     Visit Number 3    Date for PT Re-Evaluation 12/19/22    Authorization Type Medicare needs KX at visit 15    Progress Note Due on Visit 10    PT Start Time 0802    PT Stop Time 0848    PT Time Calculation (min) 46 min    Activity Tolerance Patient tolerated treatment well    Behavior During Therapy [facility] for tasks assessed/performed              Past Medical History:  Diagnosis Date   Colon polyps    Family hx of colon cancer    Hot flashes    Severe hot flashes   Hyperlipidemia    Osteoarthritis    History reviewed. No pertinent surgical history. Patient Active Problem List   Diagnosis Date Noted   Other low back pain 10/23/2021   Pain in right hip 10/23/2021      REFERRING PROVIDER: Christena Deem, MD  REFERRING DIAG: [phone] (ICD-10-CM) - Pain in right hip  Rationale for Evaluation and Treatment: Rehabilitation  THERAPY DIAG:  Pain in right hip  Muscle weakness (generalized)  Difficulty in walking, not elsewhere classified  Cramp and spasm  ONSET DATE: winter 2023  SUBJECTIVE:                                                                                                                                                                                           SUBJECTIVE STATEMENT: Patient reports she responded very well to DN last visit.  "The needling always makes me feel better"  I've woken up with Vertigo again.    Eval: "My skeleton is wearing out."  I have spinal stenosis, I have had L3 and L5 injections recently and it hasn't helped.  In the past, lumbar injections helped my hip but this time it didn't. MRI of Rt hip show some tears.  MD wants me to get stronger to see if it will help. I get pain into my Rt knee and sometimes into my foot.   I  really hope to get more DN again b/c it helped me so much last time.  PERTINENT HISTORY:  Had PT at this clinic in 2023 History of spinal stenosis Plays golf Husband has stage 4 cancer - diagnosed last year after finished PT  PAIN:  PAIN:  11/09/22 Are you having pain? Yes NPRS scale: today 2/10 Pain location: lumbar, Rt  hip to Rt knee and sometimes to ankle Pain orientation: Right  PAIN TYPE: aching, dull, tight, and burning, travels to knee and lateral calf Pain description: constant  Aggravating factors: at night, can't sleep on Rt side, being too still (sit too long) Relieving factors: staying active/moving, migun bed (38 min massage bed with heated balls), Voltaren, Aleve/Tylenol   PRECAUTIONS: None  WEIGHT BEARING RESTRICTIONS: No  FALLS:  Has patient fallen in last 6 months? No  LIVING ENVIRONMENT: Lives with: lives with their spouse Lives in: House/apartment Stairs: No Has following equipment at home: None  OCCUPATION: retired  PLOF: Independent  PATIENT GOALS: get stronger, get relief, play golf again, sleep better  NEXT MD VISIT: next month   OBJECTIVE:   DIAGNOSTIC FINDINGS:  No imaging available in chart but Pt reports history of spinal stenosis Recent MRI of Rt hip and lumbar - showed tears of Rt hip - Pt will bring reports next time  PATIENT SURVEYS:  FOTO 58, goal 61  SCREENING FOR RED FLAGS: Bowel or bladder incontinence: No Spinal tumors: No Cauda equina syndrome: No Compression fracture: No Abdominal aneurysm: No  COGNITION: Overall cognitive status: Within functional limits for tasks assessed     SENSATION: Rt ankle tingly if travels down that far  MUSCLE LENGTH: Limited quad length bil by 30%  POSTURE: decreased lumbar lordosis and decreased thoracic kyphosis  PALPATION: Rt tight and tender: TFL and ITB, vastus lateralis, tight hamstring, glut min and piriformis Atrophy present through Rt lateral hip Tight lumbar multifidi  bil   LUMBAR ROM:  Full trunk ROM without pain  LOWER EXTREMITY ROM:    Full ROM bil throughout   LOWER EXTREMITY MMT:    MMT Right eval Left eval  Hip flexion 4+ 5  Hip extension    Hip abduction 3+ 4+  Hip adduction 5 5  Hip internal rotation 5 5  Hip external rotation 4- 5  Knee flexion 5 5  Knee extension 5 5  Ankle dorsiflexion 5 5  Ankle plantarflexion    Ankle inversion    Ankle eversion     (Blank rows = not tested)  KNEE SPECIAL TESTS:  Laxity present Rt knee with increased neutral zone + Ober's Test on Rt  FUNCTIONAL TESTS:  SLS much more wobbly on Rt side but able to balance   GAIT: Distance walked: within clinic Assistive device utilized: None Level of assistance: Complete Independence Comments: lacks arm swing and trunk rotation, Pt states will hobble if have been sitting too long  TODAY'S TREATMENT:                                                                                                                              DATE:  11/09/22 NuStep L4 x 5' PT present to review status Supine fig 4 Rt stretch in the air with self-overpressure x60" Rt hip flexor stretch off edge of table - add active knee flexion for quad bias  x 60" Supine hooklying Rt ITB legs crossed stretch x60"  Supine bridge 8x 5" holds Trigger Point Dry-Needling  Treatment instructions: Expect mild to moderate muscle soreness. S/S of pneumothorax if dry needled over a lung field, and to seek immediate medical attention should they occur. Patient verbalized understanding of these instructions and education.  Patient Consent Given: Yes Education handout provided: Previously provided Muscles treated: right lateral quad Electrical stimulation performed: No Parameters: N/A Treatment response/outcome: signif twitch and release right lateral quad Aftercare from DN re-iterated   DATE:  11/07/22 NuStep L4 x 4' PT present to review status Rt hip flexor stretch off edge of table - add  active knee flexion for quad bias x 60" Supine hooklying Rt ITB legs crossed stretch x60"  Supine fig 4 Rt stretch in the air with self-overpressure x60" Supine bridge 8x 5" holds Supine SLR Rt x10 SL clam tied green tband x10 SL Rt hip abduction x 10 Standing hip extension x10 bil HEP updated Attempted squat to chair but Pt uses all knees - will work on this at future appt Trigger Point Dry-Needling  Treatment instructions: Expect mild to moderate muscle soreness. S/S of pneumothorax if dry needled over a lung field, and to seek immediate medical attention should they occur. Patient verbalized understanding of these instructions and education.  Patient Consent Given: Yes Education handout provided: Previously provided Muscles treated: bil lumbar multifidi marinating technique, Rt TFL, glut min, glut med, piriformis Electrical stimulation performed: No Parameters: N/A Treatment response/outcome: signif twitch and release Rt piriformis, tight spasm Rt TFL and glut min, deep ache and release lumbar spine Aftercare from DN re-iterated   10/24/22  Initiated HEP    PATIENT EDUCATION:  Education details: [email or web address] Person educated: Patient Education method: Programmer, multimedia, Facilities manager, Verbal cues, and Handouts Education comprehension: verbalized understanding and returned demonstration  HOME EXERCISE PROGRAM: Access Code: [email or web address] URL: [email or web address] Date: 11/07/2022 Prepared by: Loistine Simas Beuhring  Exercises - Supine Figure 4 Piriformis Stretch  - 1 x daily - 7 x weekly - 1 sets - 2-3 reps - 20-30 hold - Modified Thomas Stretch  - 1 x daily - 7 x weekly - 1 sets - 2-3 reps - 20-30 hold - Supine ITB Stretch  - 1 x daily - 7 x weekly - 1 sets - 2-3 reps - 20-30 hold - Supine Bridge  - 1 x daily - 7 x weekly - 1 sets - 10 reps - Straight Leg Raise  - 1 x daily - 7 x weekly - 1 sets - 10 reps - Clamshell with Resistance  - 1 x daily - 7 x weekly - 1 sets - 10 reps -  Sidelying Hip Abduction  - 1 x daily - 7 x weekly - 1 sets - 10 reps - Standing Hip Extension with Counter Support  - 1 x daily - 7 x weekly - 1 sets - 10 reps  ASSESSMENT:  CLINICAL IMPRESSION: Donya has two issues on the right side hip and back area.  She has hx of DDD and symptoms from that and now states she had an MRI that shows tears in the IT band.  She responded well to the DN last visit.  She has been able to do her exercises with less pain.   TPDN performed today to right lateral quad with good twitch/deep ache and release.  Pt reported feeling much looser end of session.    OBJECTIVE IMPAIRMENTS: decreased activity tolerance, decreased balance, decreased strength, hypomobility, increased fascial restrictions, increased  muscle spasms, impaired flexibility, impaired tone, improper body mechanics, postural dysfunction, and pain.   ACTIVITY LIMITATIONS: sitting, squatting, sleeping, stairs, and transfers  PARTICIPATION LIMITATIONS: cleaning, laundry, shopping, community activity, and yard work  PERSONAL FACTORS: Time since onset of injury/illness/exacerbation are also affecting patient's functional outcome.   REHAB POTENTIAL: Excellent  CLINICAL DECISION MAKING: Stable/uncomplicated  EVALUATION COMPLEXITY: Low   GOALS: Goals reviewed with patient? Yes  SHORT TERM GOALS: Target date: 11/21/22  Pt will be ind with initial HEP without exacerbation of symptoms. Baseline: Goal status: ongoing  2.  Pt will improve quad and ITB length to WNL and have negative Claiborne Rigg and Maisie Fus test Baseline:  Goal status: ongoing - improved Maisie Fus test already - changed to add knee flexion for quad bias 5/29   LONG TERM GOALS: Target date: 12/19/22  Pt will be ind with advanced HEP and understand how to safely progress recreational activities. Baseline:  Goal status: INITIAL  2.  Pt will improve Rt hip and core strength to at least 4+/5 to improve functional strength for daily tasks and  recreational activities.  Baseline:  Goal status: INITIAL  3.  Pt will be able to bend, lift and squat to allow her to garden. Baseline:  Goal status: INITIAL  4.  Pt will be able to sleep with min pain disruption Baseline:  Goal status: INITIAL  5.  Pt will be able to play golf with min or no pain. Baseline:  Goal status: INITIAL    PLAN:  PT FREQUENCY: 2x/week  PT DURATION: 8 weeks  PLANNED INTERVENTIONS: Therapeutic exercises, Therapeutic activity, Neuromuscular re-education, Balance training, Gait training, Patient/Family education, Self Care, Joint mobilization, Dry Needling, Electrical stimulation, Spinal mobilization, Cryotherapy, Moist heat, Taping, Ionotophoresis 4mg /ml Dexamethasone, and Manual therapy.  PLAN FOR NEXT SESSION: Continue to work on squat mechanics and hip hinge along with alignment, review HEP and progress as needed, f/u on DN.   Progress core and Rt hip strength, closed chain control around Rt knee (laxity present from past injury), functional strength and mobility for gardening and golf  Masaichi Kracht B. Shanesha Bednarz, PT 11/09/22 9:35 AM [facility] [address] [place], [place] [address] Phone # [phone] Fax [phone]

## 2022-11-13 ENCOUNTER — Ambulatory Visit: Payer: Medicare Other | Attending: Sports Medicine | Admitting: Physical Therapy

## 2022-11-13 ENCOUNTER — Encounter: Payer: Self-pay | Admitting: Physical Therapy

## 2022-11-13 DIAGNOSIS — M25551 Pain in right hip: Secondary | ICD-10-CM | POA: Diagnosis present

## 2022-11-13 DIAGNOSIS — G8929 Other chronic pain: Secondary | ICD-10-CM | POA: Insufficient documentation

## 2022-11-13 DIAGNOSIS — R252 Cramp and spasm: Secondary | ICD-10-CM | POA: Diagnosis present

## 2022-11-13 DIAGNOSIS — M6281 Muscle weakness (generalized): Secondary | ICD-10-CM | POA: Diagnosis present

## 2022-11-13 DIAGNOSIS — M25561 Pain in right knee: Secondary | ICD-10-CM | POA: Insufficient documentation

## 2022-11-13 DIAGNOSIS — R262 Difficulty in walking, not elsewhere classified: Secondary | ICD-10-CM | POA: Insufficient documentation

## 2022-11-13 NOTE — Therapy (Signed)
OUTPATIENT PHYSICAL THERAPY TREATMENT NOTE   Patient Name: Abigail Pruitt MRN: [id number] DOB:April 21, 1946, 77 y.o., female Today's Date: 11/13/2022  END OF SESSION:  PT End of Session - 11/13/22 1016     Visit Number 4    Date for PT Re-Evaluation 12/19/22    Authorization Type Medicare needs KX at visit 15    Progress Note Due on Visit 10    PT Start Time 1016    PT Stop Time 1058    PT Time Calculation (min) 42 min    Activity Tolerance Patient tolerated treatment well    Behavior During Therapy WFL for tasks assessed/performed               Past Medical History:  Diagnosis Date   Colon polyps    Family hx of colon cancer    Hot flashes    Severe hot flashes   Hyperlipidemia    Osteoarthritis    History reviewed. No pertinent surgical history. Patient Active Problem List   Diagnosis Date Noted   Other low back pain 10/23/2021   Pain in right hip 10/23/2021      REFERRING PROVIDER: Christena Deem, MD  REFERRING DIAG: [phone] (ICD-10-CM) - Pain in right hip  Rationale for Evaluation and Treatment: Rehabilitation  THERAPY DIAG:  Pain in right hip  Muscle weakness (generalized)  Difficulty in walking, not elsewhere classified  Cramp and spasm  ONSET DATE: winter 2023  SUBJECTIVE:                                                                                                                                                                                           SUBJECTIVE STATEMENT: I overdid it getting back into my HEP from in the past.  I get really stiff after I sit for an hour.  It loosens up when I get moving again.  I took my neighbor's dog for a walk and the Rt hip joint still holds me back.  I am able to sleep a little bit longer on the Rt side.    Eval: "My skeleton is wearing out."  I have spinal stenosis, I have had L3 and L5 injections recently and it hasn't helped.  In the past, lumbar injections helped my hip but this time it didn't. MRI of  Rt hip show some tears.  MD wants me to get stronger to see if it will help. I get pain into my Rt knee and sometimes into my foot.   I really hope to get more DN again b/c it helped me so much last time.  PERTINENT HISTORY:  Had PT at this  clinic in 2023 History of spinal stenosis Plays golf Husband has stage 4 cancer - diagnosed last year after finished PT  PAIN:  PAIN:   Are you having pain? Yes NPRS scale: today 2/10 Pain location: lumbar, Rt hip to Rt knee and sometimes to ankle Pain orientation: Right  PAIN TYPE: aching, dull, tight, and burning, travels to knee and lateral calf Pain description: constant  Aggravating factors: at night, can't sleep on Rt side, being too still (sit too long) Relieving factors: staying active/moving, migun bed (38 min massage bed with heated balls), Voltaren, Aleve/Tylenol   PRECAUTIONS: None  WEIGHT BEARING RESTRICTIONS: No  FALLS:  Has patient fallen in last 6 months? No  LIVING ENVIRONMENT: Lives with: lives with their spouse Lives in: House/apartment Stairs: No Has following equipment at home: None  OCCUPATION: retired  PLOF: Independent  PATIENT GOALS: get stronger, get relief, play golf again, sleep better  NEXT MD VISIT: next month   OBJECTIVE:   DIAGNOSTIC FINDINGS:  No imaging available in chart but Pt reports history of spinal stenosis Recent MRI of Rt hip and lumbar - showed tears of Rt hip - Pt will bring reports next time  PATIENT SURVEYS:  FOTO 58, goal 61  SCREENING FOR RED FLAGS: Bowel or bladder incontinence: No Spinal tumors: No Cauda equina syndrome: No Compression fracture: No Abdominal aneurysm: No  COGNITION: Overall cognitive status: Within functional limits for tasks assessed     SENSATION: Rt ankle tingly if travels down that far  MUSCLE LENGTH: Limited quad length bil by 30%  POSTURE: decreased lumbar lordosis and decreased thoracic kyphosis  PALPATION: Rt tight and tender: TFL and  ITB, vastus lateralis, tight hamstring, glut min and piriformis Atrophy present through Rt lateral hip Tight lumbar multifidi bil   LUMBAR ROM:  Full trunk ROM without pain  LOWER EXTREMITY ROM:    Full ROM bil throughout   LOWER EXTREMITY MMT:    MMT Right eval Left eval  Hip flexion 4+ 5  Hip extension    Hip abduction 3+ 4+  Hip adduction 5 5  Hip internal rotation 5 5  Hip external rotation 4- 5  Knee flexion 5 5  Knee extension 5 5  Ankle dorsiflexion 5 5  Ankle plantarflexion    Ankle inversion    Ankle eversion     (Blank rows = not tested)  KNEE SPECIAL TESTS:  Laxity present Rt knee with increased neutral zone + Ober's Test on Rt  FUNCTIONAL TESTS:  SLS much more wobbly on Rt side but able to balance   GAIT: Distance walked: within clinic Assistive device utilized: None Level of assistance: Complete Independence Comments: lacks arm swing and trunk rotation, Pt states will hobble if have been sitting too long  TODAY'S TREATMENT:  DATE:  11/13/22 NuStep L4 x 5' PT present to review status Trigger Point Dry-Needling  Treatment instructions: Expect mild to moderate muscle soreness. S/S of pneumothorax if dry needled over a lung field, and to seek immediate medical attention should they occur. Patient verbalized understanding of these instructions and education.  Patient Consent Given: Yes Education handout provided: Previously provided Muscles treated: Rt glut med, glut min, TFL, piriformis Electrical stimulation performed: No Parameters: N/A Treatment response/outcome: deep ache, less twitches compared to earlier sessions Supine fig 4 in the air x 60" each  ITB stretch with strap 2x30" (Pt ready to progress to this from past HEP) Sidestepping with yellow loop 3 laps 8 steps each way Rt SLS 3-way clock taps with Lt LE x 8 rounds  single UE support 4" fwd and side step up Rt LE at rail x 10 each  11/09/22 NuStep L4 x 5' PT present to review status Supine fig 4 Rt stretch in the air with self-overpressure x60" Rt hip flexor stretch off edge of table - add active knee flexion for quad bias x 60" Supine hooklying Rt ITB legs crossed stretch x60"  Supine bridge 8x 5" holds Trigger Point Dry-Needling  Treatment instructions: Expect mild to moderate muscle soreness. S/S of pneumothorax if dry needled over a lung field, and to seek immediate medical attention should they occur. Patient verbalized understanding of these instructions and education.  Patient Consent Given: Yes Education handout provided: Previously provided Muscles treated: right lateral quad Electrical stimulation performed: No Parameters: N/A Treatment response/outcome: signif twitch and release right lateral quad Aftercare from DN re-iterated   DATE:  11/07/22 NuStep L4 x 4' PT present to review status Rt hip flexor stretch off edge of table - add active knee flexion for quad bias x 60" Supine hooklying Rt ITB legs crossed stretch x60"  Supine fig 4 Rt stretch in the air with self-overpressure x60" Supine bridge 8x 5" holds Supine SLR Rt x10 SL clam tied green tband x10 SL Rt hip abduction x 10 Standing hip extension x10 bil HEP updated Attempted squat to chair but Pt uses all knees - will work on this at future appt Trigger Point Dry-Needling  Treatment instructions: Expect mild to moderate muscle soreness. S/S of pneumothorax if dry needled over a lung field, and to seek immediate medical attention should they occur. Patient verbalized understanding of these instructions and education.  Patient Consent Given: Yes Education handout provided: Previously provided Muscles treated: bil lumbar multifidi marinating technique, Rt TFL, glut min, glut med, piriformis Electrical stimulation performed: No Parameters: N/A Treatment response/outcome:  signif twitch and release Rt piriformis, tight spasm Rt TFL and glut min, deep ache and release lumbar spine Aftercare from DN re-iterated   10/24/22  Initiated HEP    PATIENT EDUCATION:  Education details: [email or web address] Person educated: Patient Education method: Programmer, multimedia, Facilities manager, Verbal cues, and Handouts Education comprehension: verbalized understanding and returned demonstration  HOME EXERCISE PROGRAM: Access Code: [email or web address] URL: [email or web address] Date: 11/07/2022 Prepared by: Loistine Simas Breeann Reposa  Exercises - Supine Figure 4 Piriformis Stretch  - 1 x daily - 7 x weekly - 1 sets - 2-3 reps - 20-30 hold - Modified Thomas Stretch  - 1 x daily - 7 x weekly - 1 sets - 2-3 reps - 20-30 hold - Supine ITB Stretch  - 1 x daily - 7 x weekly - 1 sets - 2-3 reps - 20-30 hold - Supine Bridge  - 1 x daily - 7 x weekly - 1 sets -  10 reps - Straight Leg Raise  - 1 x daily - 7 x weekly - 1 sets - 10 reps - Clamshell with Resistance  - 1 x daily - 7 x weekly - 1 sets - 10 reps - Sidelying Hip Abduction  - 1 x daily - 7 x weekly - 1 sets - 10 reps - Standing Hip Extension with Counter Support  - 1 x daily - 7 x weekly - 1 sets - 10 reps  ASSESSMENT:  CLINICAL IMPRESSION: Marquida has returned to doing old HEP from this clinic from 2023 and did 2x since last here and felt she may have overdone it.  She is now ready to progress ITB stretch with supine with strap as it has loosened up some.  She has fewer and smaller TP present in Rt LE so will taper use of DN to as needed with more focus on open and closed chain strength of Rt hip and core.  OBJECTIVE IMPAIRMENTS: decreased activity tolerance, decreased balance, decreased strength, hypomobility, increased fascial restrictions, increased muscle spasms, impaired flexibility, impaired tone, improper body mechanics, postural dysfunction, and pain.   ACTIVITY LIMITATIONS: sitting, squatting, sleeping, stairs, and transfers  PARTICIPATION  LIMITATIONS: cleaning, laundry, shopping, community activity, and yard work  PERSONAL FACTORS: Time since onset of injury/illness/exacerbation are also affecting patient's functional outcome.   REHAB POTENTIAL: Excellent  CLINICAL DECISION MAKING: Stable/uncomplicated  EVALUATION COMPLEXITY: Low   GOALS: Goals reviewed with patient? Yes  SHORT TERM GOALS: Target date: 11/21/22  Pt will be ind with initial HEP without exacerbation of symptoms. Baseline: Goal status: ongoing  2.  Pt will improve quad and ITB length to WNL and have negative Claiborne Rigg and Maisie Fus test Baseline:  Goal status: ongoing - improved Maisie Fus test already - changed to add knee flexion for quad bias 5/29   LONG TERM GOALS: Target date: 12/19/22  Pt will be ind with advanced HEP and understand how to safely progress recreational activities. Baseline:  Goal status: ongoing  2.  Pt will improve Rt hip and core strength to at least 4+/5 to improve functional strength for daily tasks and recreational activities.  Baseline:  Goal status: ongoing  3.  Pt will be able to bend, lift and squat to allow her to garden. Baseline:  Goal status: INITIAL  4.  Pt will be able to sleep with min pain disruption Baseline:  Goal status: INITIAL  5.  Pt will be able to play golf with min or no pain. Baseline:  Goal status: INITIAL    PLAN:  PT FREQUENCY: 2x/week  PT DURATION: 8 weeks  PLANNED INTERVENTIONS: Therapeutic exercises, Therapeutic activity, Neuromuscular re-education, Balance training, Gait training, Patient/Family education, Self Care, Joint mobilization, Dry Needling, Electrical stimulation, Spinal mobilization, Cryotherapy, Moist heat, Taping, Ionotophoresis 4mg /ml Dexamethasone, and Manual therapy.  PLAN FOR NEXT SESSION: Continue to work on squat mechanics and hip hinge along with alignment, review HEP and progress as needed,  DN as needed with more focus on therex.   Progress core and Rt hip strength,  closed chain control around Rt knee (laxity present from past injury), functional strength and mobility for gardening and golf  Morton Peters, PT 11/13/22 11:01 AM  [facility] [address], [address] [place], [place] [address] Phone # [phone] Fax [phone]

## 2022-11-15 ENCOUNTER — Ambulatory Visit: Payer: Medicare Other

## 2022-11-15 DIAGNOSIS — R262 Difficulty in walking, not elsewhere classified: Secondary | ICD-10-CM

## 2022-11-15 DIAGNOSIS — M25551 Pain in right hip: Secondary | ICD-10-CM | POA: Diagnosis not present

## 2022-11-15 DIAGNOSIS — M6281 Muscle weakness (generalized): Secondary | ICD-10-CM

## 2022-11-15 DIAGNOSIS — R252 Cramp and spasm: Secondary | ICD-10-CM

## 2022-11-15 DIAGNOSIS — G8929 Other chronic pain: Secondary | ICD-10-CM

## 2022-11-15 NOTE — Therapy (Signed)
OUTPATIENT PHYSICAL THERAPY TREATMENT NOTE   Patient Name: Abigail Pruitt MRN: [id number] DOB:1946/04/29, 77 y.o., female Today's Date: 11/15/2022  END OF SESSION:  PT End of Session - 11/15/22 0808     Visit Number 5    Date for PT Re-Evaluation 12/19/22    Authorization Type Medicare needs KX at visit 15    Progress Note Due on Visit 10    PT Start Time 0804    PT Stop Time 0845    PT Time Calculation (min) 41 min    Activity Tolerance Patient tolerated treatment well    Behavior During Therapy [facility] for tasks assessed/performed               Past Medical History:  Diagnosis Date   Colon polyps    Family hx of colon cancer    Hot flashes    Severe hot flashes   Hyperlipidemia    Osteoarthritis    History reviewed. No pertinent surgical history. Patient Active Problem List   Diagnosis Date Noted   Other low back pain 10/23/2021   Pain in right hip 10/23/2021      REFERRING PROVIDER: Christena Deem, MD  REFERRING DIAG: [phone] (ICD-10-CM) - Pain in right hip  Rationale for Evaluation and Treatment: Rehabilitation  THERAPY DIAG:  Pain in right hip  Muscle weakness (generalized)  Difficulty in walking, not elsewhere classified  Cramp and spasm  Chronic pain of right knee  ONSET DATE: winter 2023  SUBJECTIVE:                                                                                                                                                                                           SUBJECTIVE STATEMENT: I overdid it again, trying to get well.  I think I just push myself too much sometimes.   Eval: "My skeleton is wearing out."  I have spinal stenosis, I have had L3 and L5 injections recently and it hasn't helped.  In the past, lumbar injections helped my hip but this time it didn't. MRI of Rt hip show some tears.  MD wants me to get stronger to see if it will help. I get pain into my Rt knee and sometimes into my foot.   I really hope to get  more DN again b/c it helped me so much last time.  PERTINENT HISTORY:  Had PT at this clinic in 2023 History of spinal stenosis Plays golf Husband has stage 4 cancer - diagnosed last year after finished PT  PAIN:  PAIN:   Are you having pain? Yes NPRS scale: today 2/10 Pain location: lumbar, Rt hip  to Rt knee and sometimes to ankle Pain orientation: Right  PAIN TYPE: aching, dull, tight, and burning, travels to knee and lateral calf Pain description: constant  Aggravating factors: at night, can't sleep on Rt side, being too still (sit too long) Relieving factors: staying active/moving, migun bed (38 min massage bed with heated balls), Voltaren, Aleve/Tylenol   PRECAUTIONS: None  WEIGHT BEARING RESTRICTIONS: No  FALLS:  Has patient fallen in last 6 months? No  LIVING ENVIRONMENT: Lives with: lives with their spouse Lives in: House/apartment Stairs: No Has following equipment at home: None  OCCUPATION: retired  PLOF: Independent  PATIENT GOALS: get stronger, get relief, play golf again, sleep better  NEXT MD VISIT: next month   OBJECTIVE:   DIAGNOSTIC FINDINGS:  No imaging available in chart but Pt reports history of spinal stenosis Recent MRI of Rt hip and lumbar - showed tears of Rt hip - Pt will bring reports next time  PATIENT SURVEYS:  FOTO 58, goal 61  SCREENING FOR RED FLAGS: Bowel or bladder incontinence: No Spinal tumors: No Cauda equina syndrome: No Compression fracture: No Abdominal aneurysm: No  COGNITION: Overall cognitive status: Within functional limits for tasks assessed     SENSATION: Rt ankle tingly if travels down that far  MUSCLE LENGTH: Limited quad length bil by 30%  POSTURE: decreased lumbar lordosis and decreased thoracic kyphosis  PALPATION: Rt tight and tender: TFL and ITB, vastus lateralis, tight hamstring, glut min and piriformis Atrophy present through Rt lateral hip Tight lumbar multifidi bil   LUMBAR ROM:  Full  trunk ROM without pain  LOWER EXTREMITY ROM:    Full ROM bil throughout   LOWER EXTREMITY MMT:    MMT Right eval Left eval  Hip flexion 4+ 5  Hip extension    Hip abduction 3+ 4+  Hip adduction 5 5  Hip internal rotation 5 5  Hip external rotation 4- 5  Knee flexion 5 5  Knee extension 5 5  Ankle dorsiflexion 5 5  Ankle plantarflexion    Ankle inversion    Ankle eversion     (Blank rows = not tested)  KNEE SPECIAL TESTS:  Laxity present Rt knee with increased neutral zone + Ober's Test on Rt  FUNCTIONAL TESTS:  SLS much more wobbly on Rt side but able to balance   GAIT: Distance walked: within clinic Assistive device utilized: None Level of assistance: Complete Independence Comments: lacks arm swing and trunk rotation, Pt states will hobble if have been sitting too long  TODAY'S TREATMENT:                                                                                                                              DATE:  11/13/22 NuStep L5 x 5' PT present to review status Quad progression: quad sets x 20 SAQ x 20 with 5 lbs each LE with blue foam roller SAQ x 20 with 5 lbs each LE with larger  blue bolster SLR x 20 with 5 lbs each LE with frequent rest breaks on right LE  Supine clamshell with yellow loop x 20 Side lying clamshell with yellow loop x 20 each LE Seated LAQ x 20 each with 5 lbs Seated clam x 20 with yellow loop  Seated march x 20 with 5 lbs both Seated hip ER 2 x 10 each with 5 lbs both Sit to stand with emphasis on proper hip hinge 2 x 10  11/13/22 NuStep L4 x 5' PT present to review status Trigger Point Dry-Needling  Treatment instructions: Expect mild to moderate muscle soreness. S/S of pneumothorax if dry needled over a lung field, and to seek immediate medical attention should they occur. Patient verbalized understanding of these instructions and education.  Patient Consent Given: Yes Education handout provided: Previously provided Muscles  treated: Rt glut med, glut min, TFL, piriformis Electrical stimulation performed: No Parameters: N/A Treatment response/outcome: deep ache, less twitches compared to earlier sessions Supine fig 4 in the air x 60" each  ITB stretch with strap 2x30" (Pt ready to progress to this from past HEP) Sidestepping with yellow loop 3 laps 8 steps each way Rt SLS 3-way clock taps with Lt LE x 8 rounds single UE support 4" fwd and side step up Rt LE at rail x 10 each  11/09/22 NuStep L4 x 5' PT present to review status Supine fig 4 Rt stretch in the air with self-overpressure x60" Rt hip flexor stretch off edge of table - add active knee flexion for quad bias x 60" Supine hooklying Rt ITB legs crossed stretch x60"  Supine bridge 8x 5" holds Trigger Point Dry-Needling  Treatment instructions: Expect mild to moderate muscle soreness. S/S of pneumothorax if dry needled over a lung field, and to seek immediate medical attention should they occur. Patient verbalized understanding of these instructions and education.  Patient Consent Given: Yes Education handout provided: Previously provided Muscles treated: right lateral quad Electrical stimulation performed: No Parameters: N/A Treatment response/outcome: signif twitch and release right lateral quad Aftercare from DN re-iterated    10/24/22  Initiated HEP    PATIENT EDUCATION:  Education details: [email or web address] Person educated: Patient Education method: Programmer, multimedia, Facilities manager, Verbal cues, and Handouts Education comprehension: verbalized understanding and returned demonstration  HOME EXERCISE PROGRAM: Access Code: [email or web address] URL: [email or web address] Date: 11/07/2022 Prepared by: Loistine Simas Beuhring  Exercises - Supine Figure 4 Piriformis Stretch  - 1 x daily - 7 x weekly - 1 sets - 2-3 reps - 20-30 hold - Modified Thomas Stretch  - 1 x daily - 7 x weekly - 1 sets - 2-3 reps - 20-30 hold - Supine ITB Stretch  - 1 x daily - 7 x  weekly - 1 sets - 2-3 reps - 20-30 hold - Supine Bridge  - 1 x daily - 7 x weekly - 1 sets - 10 reps - Straight Leg Raise  - 1 x daily - 7 x weekly - 1 sets - 10 reps - Clamshell with Resistance  - 1 x daily - 7 x weekly - 1 sets - 10 reps - Sidelying Hip Abduction  - 1 x daily - 7 x weekly - 1 sets - 10 reps - Standing Hip Extension with Counter Support  - 1 x daily - 7 x weekly - 1 sets - 10 reps  ASSESSMENT:  CLINICAL IMPRESSION: Keishauna is progressing appropriately.  She is very motivated and occasionally over exerts herself trying to meet her goals.  She tolerated  today's regimen with minimal pain and had much improved squat technique with min verbal cues.   OBJECTIVE IMPAIRMENTS: decreased activity tolerance, decreased balance, decreased strength, hypomobility, increased fascial restrictions, increased muscle spasms, impaired flexibility, impaired tone, improper body mechanics, postural dysfunction, and pain.   ACTIVITY LIMITATIONS: sitting, squatting, sleeping, stairs, and transfers  PARTICIPATION LIMITATIONS: cleaning, laundry, shopping, community activity, and yard work  PERSONAL FACTORS: Time since onset of injury/illness/exacerbation are also affecting patient's functional outcome.   REHAB POTENTIAL: Excellent  CLINICAL DECISION MAKING: Stable/uncomplicated  EVALUATION COMPLEXITY: Low   GOALS: Goals reviewed with patient? Yes  SHORT TERM GOALS: Target date: 11/21/22  Pt will be ind with initial HEP without exacerbation of symptoms. Baseline: Goal status: ongoing  2.  Pt will improve quad and ITB length to WNL and have negative Claiborne Rigg and Maisie Fus test Baseline:  Goal status: ongoing - improved Maisie Fus test already - changed to add knee flexion for quad bias 5/29   LONG TERM GOALS: Target date: 12/19/22  Pt will be ind with advanced HEP and understand how to safely progress recreational activities. Baseline:  Goal status: ongoing  2.  Pt will improve Rt hip and core  strength to at least 4+/5 to improve functional strength for daily tasks and recreational activities.  Baseline:  Goal status: ongoing  3.  Pt will be able to bend, lift and squat to allow her to garden. Baseline:  Goal status: INITIAL  4.  Pt will be able to sleep with min pain disruption Baseline:  Goal status: INITIAL  5.  Pt will be able to play golf with min or no pain. Baseline:  Goal status: INITIAL    PLAN:  PT FREQUENCY: 2x/week  PT DURATION: 8 weeks  PLANNED INTERVENTIONS: Therapeutic exercises, Therapeutic activity, Neuromuscular re-education, Balance training, Gait training, Patient/Family education, Self Care, Joint mobilization, Dry Needling, Electrical stimulation, Spinal mobilization, Cryotherapy, Moist heat, Taping, Ionotophoresis 4mg /ml Dexamethasone, and Manual therapy.  PLAN FOR NEXT SESSION: Continue to work on squat mechanics and hip hinge along with alignment, review HEP and progress as needed,  DN as needed with more focus on therex.   Progress core and Rt hip strength, closed chain control around Rt knee (laxity present from past injury), functional strength and mobility for gardening and golf  Soleil Mas B. Lilyanah Celestin, PT 11/15/22 8:55 AM  [facility] [address], [address] [place], [place] [address] Phone # [phone] Fax [phone]

## 2022-11-20 ENCOUNTER — Ambulatory Visit: Payer: Medicare Other | Admitting: Physical Therapy

## 2022-11-20 DIAGNOSIS — M25551 Pain in right hip: Secondary | ICD-10-CM | POA: Diagnosis not present

## 2022-11-20 DIAGNOSIS — R262 Difficulty in walking, not elsewhere classified: Secondary | ICD-10-CM

## 2022-11-20 DIAGNOSIS — M6281 Muscle weakness (generalized): Secondary | ICD-10-CM
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
## 2022-11-22 ENCOUNTER — Encounter: Payer: Self-pay | Admitting: Physical Therapy

## 2022-11-22 ENCOUNTER — Ambulatory Visit: Payer: Medicare Other | Admitting: Physical Therapy

## 2022-11-22 DIAGNOSIS — M25551 Pain in right hip: Secondary | ICD-10-CM

## 2022-11-22 DIAGNOSIS — G8929 Other chronic pain: Secondary | ICD-10-CM

## 2022-11-22 DIAGNOSIS — M6281 Muscle weakness (generalized): Secondary | ICD-10-CM

## 2022-11-22 DIAGNOSIS — R262 Difficulty in walking, not elsewhere classified: Secondary | ICD-10-CM

## 2022-11-22 DIAGNOSIS — R252 Cramp and spasm: Secondary | ICD-10-CM

## 2022-11-22 NOTE — Therapy (Signed)
OUTPATIENT PHYSICAL THERAPY TREATMENT NOTE   Patient Name: Abigail Pruitt MRN: [id number] DOB:1945/12/29, 77 y.o., female Today's Date: 11/22/2022  END OF SESSION:  PT End of Session - 11/22/22 1020     Visit Number 7    Date for PT Re-Evaluation 12/19/22    Authorization Type Medicare needs KX at visit 15    Progress Note Due on Visit 10    PT Start Time 1015    PT Stop Time 1056    PT Time Calculation (min) 41 min    Activity Tolerance Patient tolerated treatment well    Behavior During Therapy WFL for tasks assessed/performed                Past Medical History:  Diagnosis Date   Colon polyps    Family hx of colon cancer    Hot flashes    Severe hot flashes   Hyperlipidemia    Osteoarthritis    History reviewed. No pertinent surgical history. Patient Active Problem List   Diagnosis Date Noted   Other low back pain 10/23/2021   Pain in right hip 10/23/2021      REFERRING PROVIDER: Christena Deem, MD  REFERRING DIAG: [phone] (ICD-10-CM) - Pain in right hip  Rationale for Evaluation and Treatment: Rehabilitation  THERAPY DIAG:  Pain in right hip  Muscle weakness (generalized)  Difficulty in walking, not elsewhere classified  Cramp and spasm  Chronic pain of right knee  ONSET DATE: winter 2023  SUBJECTIVE:                                                                                                                                                                                           SUBJECTIVE STATEMENT: I am much better today after the DN last time.  I have been taking it easy and using heat.  Eval: "My skeleton is wearing out."  I have spinal stenosis, I have had L3 and L5 injections recently and it hasn't helped.  In the past, lumbar injections helped my hip but this time it didn't. MRI of Rt hip show some tears.  MD wants me to get stronger to see if it will help. I get pain into my Rt knee and sometimes into my foot.   I really hope to  get more DN again b/c it helped me so much last time.  PERTINENT HISTORY:  Had PT at this clinic in 2023 History of spinal stenosis Plays golf Husband has stage 4 cancer - diagnosed last year after finished PT  PAIN:  PAIN:   Are you having pain? Yes NPRS scale: 3/10 Pain location: lumbar, Rt  hip to Rt knee and sometimes to ankle Pain orientation: Right  PAIN TYPE: aching, dull, tight, and burning, travels to knee and lateral calf Pain description: constant  Aggravating factors: at night, can't sleep on Rt side, being too still (sit too long) Relieving factors: staying active/moving, migun bed (38 min massage bed with heated balls), Voltaren, Aleve/Tylenol   PRECAUTIONS: None  WEIGHT BEARING RESTRICTIONS: No  FALLS:  Has patient fallen in last 6 months? No  LIVING ENVIRONMENT: Lives with: lives with their spouse Lives in: House/apartment Stairs: No Has following equipment at home: None  OCCUPATION: retired  PLOF: Independent  PATIENT GOALS: get stronger, get relief, play golf again, sleep better  NEXT MD VISIT: next month   OBJECTIVE:   DIAGNOSTIC FINDINGS:  No imaging available in chart but Pt reports history of spinal stenosis Recent MRI of Rt hip and lumbar - showed tears of Rt hip - Pt will bring reports next time  PATIENT SURVEYS:  FOTO 58, goal 61  SCREENING FOR RED FLAGS: Bowel or bladder incontinence: No Spinal tumors: No Cauda equina syndrome: No Compression fracture: No Abdominal aneurysm: No  COGNITION: Overall cognitive status: Within functional limits for tasks assessed     SENSATION: Rt ankle tingly if travels down that far  MUSCLE LENGTH: Limited quad length bil by 30%  POSTURE: decreased lumbar lordosis and decreased thoracic kyphosis  PALPATION: Rt tight and tender: TFL and ITB, vastus lateralis, tight hamstring, glut min and piriformis Atrophy present through Rt lateral hip Tight lumbar multifidi bil   LUMBAR ROM:  Full  trunk ROM without pain  LOWER EXTREMITY ROM:    Full ROM bil throughout   LOWER EXTREMITY MMT:    MMT Right eval Left eval  Hip flexion 4+ 5  Hip extension    Hip abduction 3+ 4+  Hip adduction 5 5  Hip internal rotation 5 5  Hip external rotation 4- 5  Knee flexion 5 5  Knee extension 5 5  Ankle dorsiflexion 5 5  Ankle plantarflexion    Ankle inversion    Ankle eversion     (Blank rows = not tested)  KNEE SPECIAL TESTS:  Laxity present Rt knee with increased neutral zone + Ober's Test on Rt  FUNCTIONAL TESTS:  SLS much more wobbly on Rt side but able to balance   GAIT: Distance walked: within clinic Assistive device utilized: None Level of assistance: Complete Independence Comments: lacks arm swing and trunk rotation, Pt states will hobble if have been sitting too long  TODAY'S TREATMENT:                                                                                                                              DATE:  11/22/22: NuStep L5 x 5' PT present to review status Rt hip flexor stretch with knee flexion for quad bias off edge of table 3x30" Sit to stand from elevated table, then from chair height, then squat  to table at chair height to work on body mechanics hip hinge with knee flexion  Seated 6# core series:  hip to hip, shoulder to hip diagonals, Vs, ear to ear 5x each Chair sit up with 6lb dumbbell press overhead x 5 Seated pink power cord dead lifts 12x cord under feet Seated pink power cord with floor slider hip abduction 12x right/left Supine alt heel taps to table 3x10 Isometric dead bug at 90/90 x 20", add dead bug alt opp hand to knee taps Supine LE scissor x 20 Supine LE bicycle x 20 SLS with single ski pole LE 3-way reach taps x 5 rounds bil Bwd resistance cable walks 10lb UE handles x6 Standing row 10lb cable 2x10  11/20/22 NuStep L5 x 5' PT present to review status Supine piriformis stretch with excellent hip mobility noted Seated 6# core  series:  hip to hip, shoulder to hip diagonals, Vs, ear to ear 5x each Seated 6# sit up push the weight up 5x Seated pink power cord dead lifts 10x Seated pink power cord with floor slider hip abduction 12x right/left Manual therapy:  soft tissue mobilization to gluteals and bil lumbar paraspinals Trigger Point Dry-Needling  Treatment instructions: Expect mild to moderate muscle soreness. S/S of pneumothorax if dry needled over a lung field, and to seek immediate medical attention should they occur. Patient verbalized understanding of these instructions and education.  Patient Consent Given: Yes Education handout provided: Previously provided Muscles treated: Rt glutes in sidelying; prone bil lumbar multfidi Electrical stimulation performed: No Parameters: N/A Treatment response/outcome: deep ache, less twitches compared to earlier sessions  11/13/22 NuStep L5 x 5' PT present to review status Quad progression: quad sets x 20 SAQ x 20 with 5 lbs each LE with blue foam roller SAQ x 20 with 5 lbs each LE with larger blue bolster SLR x 20 with 5 lbs each LE with frequent rest breaks on right LE  Supine clamshell with yellow loop x 20 Side lying clamshell with yellow loop x 20 each LE Seated LAQ x 20 each with 5 lbs Seated clam x 20 with yellow loop  Seated march x 20 with 5 lbs both Seated hip ER 2 x 10 each with 5 lbs both Sit to stand with emphasis on proper hip hinge 2 x 10    PATIENT EDUCATION:  Education details: [email or web address] Person educated: Patient Education method: Programmer, multimedia, Demonstration, Verbal cues, and Handouts Education comprehension: verbalized understanding and returned demonstration  HOME EXERCISE PROGRAM: Access Code: [email or web address] URL: [email or web address] Date: 11/07/2022 Prepared by: Loistine Simas Kemond Amorin  Exercises - Supine Figure 4 Piriformis Stretch  - 1 x daily - 7 x weekly - 1 sets - 2-3 reps - 20-30 hold - Modified Thomas Stretch  - 1 x daily - 7 x  weekly - 1 sets - 2-3 reps - 20-30 hold - Supine ITB Stretch  - 1 x daily - 7 x weekly - 1 sets - 2-3 reps - 20-30 hold - Supine Bridge  - 1 x daily - 7 x weekly - 1 sets - 10 reps - Straight Leg Raise  - 1 x daily - 7 x weekly - 1 sets - 10 reps - Clamshell with Resistance  - 1 x daily - 7 x weekly - 1 sets - 10 reps - Sidelying Hip Abduction  - 1 x daily - 7 x weekly - 1 sets - 10 reps - Standing Hip Extension with Counter Support  - 1 x daily - 7 x  weekly - 1 sets - 10 reps  ASSESSMENT:  CLINICAL IMPRESSION: Session spent with more cueing and education on recruitment of deeper stabilizing muscles with some overlay of global prime movers.  Pt with excellent mobility and leads a busy active life but PT suspicion that she is not adequately stabilizing within her activity.  She reported signif relief of LBP after DN last visit and was able to fully participate today with report of feeling great and loose end of session.  She understands the role of deep core vs prime movers.   OBJECTIVE IMPAIRMENTS: decreased activity tolerance, decreased balance, decreased strength, hypomobility, increased fascial restrictions, increased muscle spasms, impaired flexibility, impaired tone, improper body mechanics, postural dysfunction, and pain.   ACTIVITY LIMITATIONS: sitting, squatting, sleeping, stairs, and transfers  PARTICIPATION LIMITATIONS: cleaning, laundry, shopping, community activity, and yard work  PERSONAL FACTORS: Time since onset of injury/illness/exacerbation are also affecting patient's functional outcome.   REHAB POTENTIAL: Excellent  CLINICAL DECISION MAKING: Stable/uncomplicated  EVALUATION COMPLEXITY: Low   GOALS: Goals reviewed with patient? Yes  SHORT TERM GOALS: Target date: 11/21/22  Pt will be ind with initial HEP without exacerbation of symptoms. Baseline: Goal status: MET 6/13  2.  Pt will improve quad and ITB length to WNL and have negative Claiborne Rigg and Maisie Fus  test Baseline:  Goal status: MET 6/13   LONG TERM GOALS: Target date: 12/19/22  Pt will be ind with advanced HEP and understand how to safely progress recreational activities. Baseline:  Goal status: ongoing  2.  Pt will improve Rt hip and core strength to at least 4+/5 to improve functional strength for daily tasks and recreational activities.  Baseline:  Goal status: ongoing  3.  Pt will be able to bend, lift and squat to allow her to garden. Baseline:  Goal status: INITIAL  4.  Pt will be able to sleep with min pain disruption Baseline:  Goal status: INITIAL  5.  Pt will be able to play golf with min or no pain. Baseline:  Goal status: INITIAL    PLAN:  PT FREQUENCY: 2x/week  PT DURATION: 8 weeks  PLANNED INTERVENTIONS: Therapeutic exercises, Therapeutic activity, Neuromuscular re-education, Balance training, Gait training, Patient/Family education, Self Care, Joint mobilization, Dry Needling, Electrical stimulation, Spinal mobilization, Cryotherapy, Moist heat, Taping, Ionotophoresis 4mg /ml Dexamethasone, and Manual therapy.  PLAN FOR NEXT SESSION: assess response to exercise and addition of lumbar multifidi DN; Continue to work on squat mechanics and hip hinge along with alignment, review HEP and progress as needed,  DN as needed with more focus on therex.   Progress core and Rt hip strength, closed chain control around Rt knee (laxity present from past injury), functional strength and mobility for gardening and golf  Morton Peters, PT 11/22/22 10:57 AM  Phone: [phone] Fax: [phone]  [facility] [address] [place], [place] [address] Phone # [phone] Fax [phone]

## 2022-11-26 ENCOUNTER — Ambulatory Visit: Payer: Medicare Other | Admitting: Physical Therapy

## 2022-11-26 ENCOUNTER — Encounter: Payer: Self-pay | Admitting: Physical Therapy

## 2022-11-26 DIAGNOSIS — R262 Difficulty in walking, not elsewhere classified: Secondary | ICD-10-CM

## 2022-11-26 DIAGNOSIS — M6281 Muscle weakness (generalized): Secondary | ICD-10-CM

## 2022-11-26 DIAGNOSIS — M25551 Pain in right hip: Secondary | ICD-10-CM

## 2022-11-26 DIAGNOSIS — R252 Cramp and spasm: Secondary | ICD-10-CM

## 2022-11-26 NOTE — Therapy (Signed)
OUTPATIENT PHYSICAL THERAPY TREATMENT NOTE   Patient Name: Abigail Pruitt MRN: [id number] DOB:1945-06-28, 77 y.o., female Today's Date: 11/26/2022  END OF SESSION:  PT End of Session - 11/26/22 0928     Visit Number 8    Date for PT Re-Evaluation 12/19/22    Authorization Type Medicare needs KX at visit 15    Progress Note Due on Visit 10    PT Start Time 0929    PT Stop Time 1013    PT Time Calculation (min) 44 min    Activity Tolerance Patient tolerated treatment well    Behavior During Therapy [facility] for tasks assessed/performed                 Past Medical History:  Diagnosis Date   Colon polyps    Family hx of colon cancer    Hot flashes    Severe hot flashes   Hyperlipidemia    Osteoarthritis    History reviewed. No pertinent surgical history. Patient Active Problem List   Diagnosis Date Noted   Other low back pain 10/23/2021   Pain in right hip 10/23/2021      REFERRING PROVIDER: Christena Deem, MD  REFERRING DIAG: [phone] (ICD-10-CM) - Pain in right hip  Rationale for Evaluation and Treatment: Rehabilitation  THERAPY DIAG:  Pain in right hip  Muscle weakness (generalized)  Difficulty in walking, not elsewhere classified  Cramp and spasm  ONSET DATE: winter 2023  SUBJECTIVE:                                                                                                                                                                                           SUBJECTIVE STATEMENT: I was busy celebrating since I was last here so didn't do much of my HEP but I did pick a few exercises (standing row and hamstring stretches) - those help combat my stiffness.  I am still feeling pretty good overall once I work my kinks out.  Eval: "My skeleton is wearing out."  I have spinal stenosis, I have had L3 and L5 injections recently and it hasn't helped.  In the past, lumbar injections helped my hip but this time it didn't. MRI of Rt hip show some tears.  MD  wants me to get stronger to see if it will help. I get pain into my Rt knee and sometimes into my foot.   I really hope to get more DN again b/c it helped me so much last time.  PERTINENT HISTORY:  Had PT at this clinic in 2023 History of spinal stenosis Plays golf Husband has stage 4 cancer - diagnosed  last year after finished PT  PAIN:  PAIN:   Are you having pain? Yes NPRS scale: 5/10 Pain location: lumbar, Rt hip to Rt knee and sometimes to ankle Pain orientation: Right  PAIN TYPE: aching, dull, tight, and burning, travels to knee and lateral calf Pain description: constant  Aggravating factors: at night, can't sleep on Rt side, being too still (sit too long) Relieving factors: staying active/moving, migun bed (38 min massage bed with heated balls), Voltaren, Aleve/Tylenol   PRECAUTIONS: None  WEIGHT BEARING RESTRICTIONS: No  FALLS:  Has patient fallen in last 6 months? No  LIVING ENVIRONMENT: Lives with: lives with their spouse Lives in: House/apartment Stairs: No Has following equipment at home: None  OCCUPATION: retired  PLOF: Independent  PATIENT GOALS: get stronger, get relief, play golf again, sleep better  NEXT MD VISIT: next month   OBJECTIVE:   DIAGNOSTIC FINDINGS:  No imaging available in chart but Pt reports history of spinal stenosis Recent MRI of Rt hip and lumbar - showed tears of Rt hip - Pt will bring reports next time  PATIENT SURVEYS:  FOTO 58, goal 61  SCREENING FOR RED FLAGS: Bowel or bladder incontinence: No Spinal tumors: No Cauda equina syndrome: No Compression fracture: No Abdominal aneurysm: No  COGNITION: Overall cognitive status: Within functional limits for tasks assessed     SENSATION: Rt ankle tingly if travels down that far  MUSCLE LENGTH: Limited quad length bil by 30%  POSTURE: decreased lumbar lordosis and decreased thoracic kyphosis  PALPATION: Rt tight and tender: TFL and ITB, vastus lateralis, tight  hamstring, glut min and piriformis Atrophy present through Rt lateral hip Tight lumbar multifidi bil   LUMBAR ROM:  Full trunk ROM without pain  LOWER EXTREMITY ROM:    Full ROM bil throughout   LOWER EXTREMITY MMT:    MMT Right eval Left eval  Hip flexion 4+ 5  Hip extension    Hip abduction 3+ 4+  Hip adduction 5 5  Hip internal rotation 5 5  Hip external rotation 4- 5  Knee flexion 5 5  Knee extension 5 5  Ankle dorsiflexion 5 5  Ankle plantarflexion    Ankle inversion    Ankle eversion     (Blank rows = not tested)  KNEE SPECIAL TESTS:  Laxity present Rt knee with increased neutral zone + Ober's Test on Rt  FUNCTIONAL TESTS:  SLS much more wobbly on Rt side but able to balance   GAIT: Distance walked: within clinic Assistive device utilized: None Level of assistance: Complete Independence Comments: lacks arm swing and trunk rotation, Pt states will hobble if have been sitting too long  TODAY'S TREATMENT:                                                                                                                              DATE:  11/26/22: NuStep L5 x 5' PT present to review status Rt hip flexor stretch  with knee flexion for quad bias off edge of table 2x30" Seated 6# core series: hip to hip x 20, hip to shoulder x10 each, ear to ear x 10 Sit to stand with 6lb overhead press x10 from chair feet on airex pad  Bwd resistance cable walks 10lb UE handles x8 Standing row 10lb cable 2x10 SLS with single ski pole LE 3-way reach taps x 5 rounds bil Standing T at counter 2x5 bil Sidestepping green loop at ankles 3 laps along counter (no UE support) Seated deadlift with red powercord under feet x10 Supine PPT x 10 Isometric dead bug at 90/90 x 20", add dead bug alt opp hand to knee taps Supine LE scissor x 20 Supine LE bicycle x 20 Supine bil UE horiz abd blue x 15 Seated 3-way raise 2lb bil x 5 rounds Supine bridge 1x15 with 10lb weight plate on anterior  pelvis Supine bil UE raise 2lb x10 Prone single UE extension, horiz abd and row 2lb x 8 each, bil  11/22/22: NuStep L5 x 5' PT present to review status Rt hip flexor stretch with knee flexion for quad bias off edge of table 3x30" Sit to stand from elevated table, then from chair height, then squat to table at chair height to work on body mechanics hip hinge with knee flexion  Seated 6# core series:  hip to hip, shoulder to hip diagonals, Vs, ear to ear 5x each Chair sit up with 6lb dumbbell press overhead x 5 Seated pink power cord dead lifts 12x cord under feet Seated pink power cord with floor slider hip abduction 12x right/left Supine alt heel taps to table 3x10 Isometric dead bug at 90/90 x 20", add dead bug alt opp hand to knee taps Supine LE scissor x 20 Supine LE bicycle x 20 SLS with single ski pole LE 3-way reach taps x 5 rounds bil Bwd resistance cable walks 10lb UE handles x6 Standing row 10lb cable 2x10  11/20/22 NuStep L5 x 5' PT present to review status Supine piriformis stretch with excellent hip mobility noted Seated 6# core series:  hip to hip, shoulder to hip diagonals, Vs, ear to ear 5x each Seated 6# sit up push the weight up 5x Seated pink power cord dead lifts 10x Seated pink power cord with floor slider hip abduction 12x right/left Manual therapy:  soft tissue mobilization to gluteals and bil lumbar paraspinals Trigger Point Dry-Needling  Treatment instructions: Expect mild to moderate muscle soreness. S/S of pneumothorax if dry needled over a lung field, and to seek immediate medical attention should they occur. Patient verbalized understanding of these instructions and education.  Patient Consent Given: Yes Education handout provided: Previously provided Muscles treated: Rt glutes in sidelying; prone bil lumbar multfidi Electrical stimulation performed: No Parameters: N/A Treatment response/outcome: deep ache, less twitches compared to earlier  sessions   PATIENT EDUCATION:  Education details: [email or web address] Person educated: Patient Education method: Programmer, multimedia, Facilities manager, Verbal cues, and Handouts Education comprehension: verbalized understanding and returned demonstration  HOME EXERCISE PROGRAM: Access Code: [email or web address] URL: [email or web address] Date: 11/07/2022 Prepared by: Loistine Simas Destanie Tibbetts  Exercises - Supine Figure 4 Piriformis Stretch  - 1 x daily - 7 x weekly - 1 sets - 2-3 reps - 20-30 hold - Modified Thomas Stretch  - 1 x daily - 7 x weekly - 1 sets - 2-3 reps - 20-30 hold - Supine ITB Stretch  - 1 x daily - 7 x weekly - 1 sets - 2-3 reps - 20-30 hold - Supine  Bridge  - 1 x daily - 7 x weekly - 1 sets - 10 reps - Straight Leg Raise  - 1 x daily - 7 x weekly - 1 sets - 10 reps - Clamshell with Resistance  - 1 x daily - 7 x weekly - 1 sets - 10 reps - Sidelying Hip Abduction  - 1 x daily - 7 x weekly - 1 sets - 10 reps - Standing Hip Extension with Counter Support  - 1 x daily - 7 x weekly - 1 sets - 10 reps  ASSESSMENT:  CLINICAL IMPRESSION: Pt reports initial stiffness in AM but she is able to select some stretches from HEP and loosens up.  She arrived with 5/10 stiffness/pain rating but left with 010 end of session suggesting a very positive response to therex.  She is challenged by SLS therex and is quick to fatigue so keeping reps low and circulating around varied muscle groups within session allowed for good tolerance of exercise focused session.  She reported signif relief of LBP after DN last week and was able to fully participate in therex the past two sessions with report of feeling great and loose end of session.  She understands the role of deep core vs prime movers.   OBJECTIVE IMPAIRMENTS: decreased activity tolerance, decreased balance, decreased strength, hypomobility, increased fascial restrictions, increased muscle spasms, impaired flexibility, impaired tone, improper body mechanics,  postural dysfunction, and pain.   ACTIVITY LIMITATIONS: sitting, squatting, sleeping, stairs, and transfers  PARTICIPATION LIMITATIONS: cleaning, laundry, shopping, community activity, and yard work  PERSONAL FACTORS: Time since onset of injury/illness/exacerbation are also affecting patient's functional outcome.   REHAB POTENTIAL: Excellent  CLINICAL DECISION MAKING: Stable/uncomplicated  EVALUATION COMPLEXITY: Low   GOALS: Goals reviewed with patient? Yes  SHORT TERM GOALS: Target date: 11/21/22  Pt will be ind with initial HEP without exacerbation of symptoms. Baseline: Goal status: MET 6/13  2.  Pt will improve quad and ITB length to WNL and have negative Claiborne Rigg and Maisie Fus test Baseline:  Goal status: MET 6/13   LONG TERM GOALS: Target date: 12/19/22  Pt will be ind with advanced HEP and understand how to safely progress recreational activities. Baseline:  Goal status: ongoing  2.  Pt will improve Rt hip and core strength to at least 4+/5 to improve functional strength for daily tasks and recreational activities.  Baseline:  Goal status: ongoing  3.  Pt will be able to bend, lift and squat to allow her to garden. Baseline:  Goal status: INITIAL  4.  Pt will be able to sleep with min pain disruption Baseline:  Goal status: INITIAL  5.  Pt will be able to play golf with min or no pain. Baseline:  Goal status: INITIAL    PLAN:  PT FREQUENCY: 2x/week  PT DURATION: 8 weeks  PLANNED INTERVENTIONS: Therapeutic exercises, Therapeutic activity, Neuromuscular re-education, Balance training, Gait training, Patient/Family education, Self Care, Joint mobilization, Dry Needling, Electrical stimulation, Spinal mobilization, Cryotherapy, Moist heat, Taping, Ionotophoresis 4mg /ml Dexamethasone, and Manual therapy.  PLAN FOR NEXT SESSION: assess response to exercise and addition of lumbar multifidi DN; Continue to work on squat mechanics and hip hinge along with alignment,  review HEP and progress as needed,  DN as needed with more focus on therex.   Progress core and Rt hip strength, closed chain control around Rt knee (laxity present from past injury), functional strength and mobility for gardening and golf  Morton Peters, PT 11/26/22 10:15 AM   Phone:  [phone] Fax: [phone]  [facility] [address], [address] [place], [place] [address] Phone # [phone] Fax [phone]

## 2022-11-28 ENCOUNTER — Ambulatory Visit: Payer: Medicare Other | Attending: Sports Medicine | Admitting: Physical Therapy

## 2022-11-28 ENCOUNTER — Encounter: Payer: Self-pay | Admitting: Physical Therapy

## 2022-11-28 ENCOUNTER — Other Ambulatory Visit: Payer: Self-pay

## 2022-11-28 DIAGNOSIS — R42 Dizziness and giddiness: Secondary | ICD-10-CM | POA: Diagnosis present

## 2022-11-28 NOTE — Therapy (Signed)
OUTPATIENT PHYSICAL THERAPY VESTIBULAR EVALUATION     Patient Name: Abigail Pruitt MRN: [id number] DOB:[birth year], 77 y.o., female Today's Date: 11/28/2022  END OF SESSION:  PT End of Session - 11/28/22 1548     Visit Number 1   1 for vertigo, 8 for hip   Date for PT Re-Evaluation 12/26/22   for vertigo, 12/19/22 for hip   Authorization Type Medicare needs KX at visit 15    Progress Note Due on Visit 10    PT Start Time 1447    PT Stop Time 1532    PT Time Calculation (min) 45 min    Activity Tolerance Patient tolerated treatment well    Behavior During Therapy WFL for tasks assessed/performed             Past Medical History:  Diagnosis Date   Colon polyps    Family hx of colon cancer    Hot flashes    Severe hot flashes   Hyperlipidemia    Osteoarthritis    History reviewed. No pertinent surgical history. Patient Active Problem List   Diagnosis Date Noted   Other low back pain 10/23/2021   Pain in right hip 10/23/2021    PCP: Jonette Mate MD REFERRING PROVIDER: Christena Deem MD   REFERRING DIAG: BPPV, R hip pain   THERAPY DIAG:  Dizziness and giddiness  ONSET DATE: couple of months ago   Rationale for Evaluation and Treatment: Rehabilitation  SUBJECTIVE:   SUBJECTIVE STATEMENT: One morning I woke up and the world was just spinning and spinning, I've not had a vertigo free day since. I had vertigo when I was here the other day. Sometimes I just sit and I feel a spin. I have to move really slowly when I change positions, sometimes I think I'm fine then it hits me and I spin. Not sure how long its lasting.  My vision got blurry a couple times when this happened. I had Covid on the 20th of May, had vertigo before and Covid. Took Paxlovid for my Covid. No hearing changes. Occasional blurry vision but never double.   Pt accompanied by: self  PERTINENT HISTORY: HLD, OA, extensive cervical fusion C1-C7   PAIN:  Are you having pain? No 0/10, dizziness  0/10  PRECAUTIONS: None  WEIGHT BEARING RESTRICTIONS: No  FALLS: Has patient fallen in last 6 months? No  LIVING ENVIRONMENT: Lives with: lives with their spouse Lives in: House/apartment Stairs:  Has following equipment at home: None  PLOF: Independent, Independent with basic ADLs, Independent with gait, and Independent with transfers  PATIENT GOALS: get rid of dizziness   OBJECTIVE:     Cervical ROM:    Active A/PROM (deg) eval  Flexion 35*  Extension 17*  Right lateral flexion 20*  Left lateral flexion 20*  Right rotation 15*  Left rotation 14*  (Blank rows = not tested)      PATIENT SURVEYS:  FOTO not in system for vertigo   VESTIBULAR ASSESSMENT:  GENERAL OBSERVATION: comfortable at rest, no signs of distress    SYMPTOM BEHAVIOR:  Subjective history: see above   Non-Vestibular symptoms: changes in vision  Type of dizziness: Blurred Vision, Spinning/Vertigo, and "World moves"  Frequency: at least once a day   Duration: unsure  Aggravating factors: Spontaneous and Induced by position change: lying supine and supine to sit  Relieving factors: rest and slow movements  Progression of symptoms: unchanged but at the same time "worst case I've ever had"   OCULOMOTOR EXAM:  Ocular Alignment: normal  Ocular ROM: No Limitations  Spontaneous Nystagmus: absent  Gaze-Induced Nystagmus: age appropriate nystagmus at end range  Smooth Pursuits: intact  Saccades: intact  Convergence/Divergence: unable to provoke, able to converge beyond WNL without double vision     VESTIBULAR - OCULAR REFLEX:   Slow VOR: Normal  VOR Cancellation: Normal  Head-Impulse Test: HIT Right: negative HIT Left: negative but limited due to neck ROM      POSITIONAL TESTING:   Attempted DH testing- unable to get accurate test due to limited cervical ROM Tried testing posterior canal in Brandt-Daroff: R side negative, L side symptomatic (difficult to catch as nystagmus and symptoms  resolved in 2-3 seconds) Attempted roll test unable to get accurate result due to limited cervical ROM Modified roll test and just had her lay on her L side (no nystagmus or vertigo but she does have occasional vertigo when laying on L side)     VESTIBULAR TREATMENT:                                                                                                   DATE:   Eval  Objective measures/care planning/appropriate education  Treatment  Semont for L sided BPPV x3    PATIENT EDUCATION: Education details: exam findings, anatomy and function of posterior/anterior/horizontal canal, differences between cannolithiasis and cupulolithiasis, HEP, limitations in PT treatments given cervical ROM limits  Person educated: Patient Education method: Explanation Education comprehension: verbalized understanding, returned demonstration, and needs further education  HOME EXERCISE PROGRAM:  Access Code: [facility] URL: [email or web address] Date: 11/28/2022 Prepared by: Nedra Hai  Exercises - Brandt-Daroff Vestibular Exercise  - 1 x daily - 7 x weekly - 3 sets - 10 reps  GOALS: Goals reviewed with patient? Yes  SHORT TERM GOALS: Target date: 12/26/2022    Will be compliant with appropriate progressive HEP to include appropriate modified exercises for BPPV   Baseline: Goal status: INITIAL  2.  Dizziness to be no more than 1/10 with positional changes  Baseline:  Goal status: INITIAL  3.  Will be able to verbalize signs/symptoms of concern and how to manage orthostatic hypotension on an independent basis  Baseline:  Goal status: INITIAL    LONG TERM GOALS: Target date: no LTGs appropriate, anticipate good tolerance to therapy and no need for long POC- will update later if necessary/appropriate    ASSESSMENT:  CLINICAL IMPRESSION: Patient is a 77 y.o. F who was seen today for physical therapy evaluation and treatment for vertigo. Of note, she has been  coming to this clinic for her hip pain, has been noticing severe dizziness with positional changes in therapy and during HEP. Also of note, she has a history of extensive cervical surgery (C1-C7 fusion) with severe limitations in cervical ROM which did complicate vestibular eval. Unable to perform Epley or Gilberto Better due to cervical ROM limitations, but we were able to aggravate symptoms with Brandt-Daroff- seems to have likely L sided posterior canal BPPV . She does endorse some dizziness when rolling onto her left side, also not able to perform effective roll test  due to cervical ROM limitations, unable to aggravate vertigo in left side lying today.  Nystagmus in Brandt-Daroff is very rapid (1-2 seconds duration), she did have longer lasting feelings of light headedness with return to sitting- will need to check formal orthostatics next session.   OBJECTIVE IMPAIRMENTS: decreased activity tolerance, decreased mobility, and dizziness.   ACTIVITY LIMITATIONS: sitting, locomotion level, and caring for others  PARTICIPATION LIMITATIONS: meal prep, cleaning, laundry, driving, shopping, and community activity  PERSONAL FACTORS: Time since onset of injury/illness/exacerbation and 1 comorbidity: extensive cervical fusion  are also affecting patient's functional outcome.   REHAB POTENTIAL: Good  CLINICAL DECISION MAKING: Evolving/moderate complexity  EVALUATION COMPLEXITY: Moderate   PLAN:  PT FREQUENCY:  1x/week for first 2 weeks (due to pt schedule/clinic holiday schedule in early July, then 2/week for next 2 weeks)  PT DURATION: 4 weeks  PLANNED INTERVENTIONS: Therapeutic exercises, Therapeutic activity, Neuromuscular re-education, Balance training, Vestibular training, Canalith repositioning, Manual therapy, and Re-evaluation  PLAN FOR NEXT SESSION: needs orthostatics checked, consider DGI. Otherwise focus on BPPV interventions as able given limited cervical ROM.   Nedra Hai, PT,  DPT 11/28/22 3:49 PM

## 2022-11-29 ENCOUNTER — Encounter: Payer: Medicare Other | Admitting: Physical Therapy

## 2022-12-04 ENCOUNTER — Ambulatory Visit: Payer: Medicare Other | Admitting: Physical Therapy

## 2022-12-04 ENCOUNTER — Encounter: Payer: Self-pay | Admitting: Physical Therapy

## 2022-12-04 DIAGNOSIS — R262 Difficulty in walking, not elsewhere classified: Secondary | ICD-10-CM

## 2022-12-04 DIAGNOSIS — M25551 Pain in right hip: Secondary | ICD-10-CM

## 2022-12-04 DIAGNOSIS — G8929 Other chronic pain: Secondary | ICD-10-CM

## 2022-12-04 DIAGNOSIS — R252 Cramp and spasm: Secondary | ICD-10-CM

## 2022-12-04 DIAGNOSIS — M6281 Muscle weakness (generalized): Secondary | ICD-10-CM

## 2022-12-04 NOTE — Therapy (Signed)
OUTPATIENT PHYSICAL THERAPY TREATMENT NOTE   Progress Note  Reporting Period 10/24/22 to 12/04/22  See note below for Objective Data and Assessment of Progress/Goals.       Patient Name: Abigail Pruitt MRN: [id number] DOB:1946-05-27, 77 y.o., female Today's Date: 12/04/2022  END OF SESSION:  PT End of Session - 12/04/22 1019     Visit Number 10    Date for PT Re-Evaluation 12/26/22    Authorization Type Medicare needs KX at visit 15    Progress Note Due on Visit 20    PT Start Time 1017    PT Stop Time 1055    PT Time Calculation (min) 38 min    Activity Tolerance Patient tolerated treatment well    Behavior During Therapy WFL for tasks assessed/performed                  Past Medical History:  Diagnosis Date   Colon polyps    Family hx of colon cancer    Hot flashes    Severe hot flashes   Hyperlipidemia    Osteoarthritis    History reviewed. No pertinent surgical history. Patient Active Problem List   Diagnosis Date Noted   Other low back pain 10/23/2021   Pain in right hip 10/23/2021      REFERRING PROVIDER: Christena Deem, MD  REFERRING DIAG: [phone] (ICD-10-CM) - Pain in right hip  Rationale for Evaluation and Treatment: Rehabilitation  THERAPY DIAG:  Pain in right hip  Muscle weakness (generalized)  Difficulty in walking, not elsewhere classified  Cramp and spasm  Chronic pain of right knee  ONSET DATE: winter 2023  SUBJECTIVE:                                                                                                                                                                                           SUBJECTIVE STATEMENT: I am about 40% improved since starting PT.   Sometimes I have to grab the right hip to do the exercises. The released muscles of the back and hip are no longer feeling released again.  The Rt hip is really giving me trouble.  Eval: "My skeleton is wearing out."  I have spinal stenosis, I have had  L3 and L5 injections recently and it hasn't helped.  In the past, lumbar injections helped my hip but this time it didn't. MRI of Rt hip show some tears.  MD wants me to get stronger to see if it will help. I get pain into my Rt knee and sometimes into my foot.   I really hope to get more DN again  b/c it helped me so much last time.  PERTINENT HISTORY:  Had PT at this clinic in 2023 History of spinal stenosis Plays golf Husband has stage 4 cancer - diagnosed last year after finished PT  PAIN:  PAIN:   Are you having pain? Yes NPRS scale: 4/10 Pain location: lumbar, Rt hip to Rt knee and sometimes to ankle Pain orientation: Right  PAIN TYPE: aching, dull, tight, and burning, travels to knee and lateral calf Pain description: constant  Aggravating factors: at night, can't sleep on Rt side, being too still (sit too long) Relieving factors: staying active/moving, migun bed (38 min massage bed with heated balls), Voltaren, Aleve/Tylenol   PRECAUTIONS: None  WEIGHT BEARING RESTRICTIONS: No  FALLS:  Has patient fallen in last 6 months? No  LIVING ENVIRONMENT: Lives with: lives with their spouse Lives in: House/apartment Stairs: No Has following equipment at home: None  OCCUPATION: retired  PLOF: Independent  PATIENT GOALS: get stronger, get relief, play golf again, sleep better  NEXT MD VISIT: next month   OBJECTIVE:   DIAGNOSTIC FINDINGS:  No imaging available in chart but Pt reports history of spinal stenosis Recent MRI of Rt hip and lumbar - showed tears of Rt hip - Pt will bring reports next time  PATIENT SURVEYS:  12/04/22: FOTO 83% goal met FOTO 58, goal 61  SCREENING FOR RED FLAGS: Bowel or bladder incontinence: No Spinal tumors: No Cauda equina syndrome: No Compression fracture: No Abdominal aneurysm: No  COGNITION: Overall cognitive status: Within functional limits for tasks assessed     SENSATION: Rt ankle tingly if travels down that far  MUSCLE  LENGTH: 6/25: quad length WNL bil Limited quad length bil by 30%  POSTURE: decreased lumbar lordosis and decreased thoracic kyphosis  PALPATION: Rt tight and tender: TFL and ITB, vastus lateralis, tight hamstring, glut min and piriformis Atrophy present through Rt lateral hip Tight lumbar multifidi bil   LUMBAR ROM:  Full trunk ROM without pain  LOWER EXTREMITY ROM:    Full ROM bil throughout   LOWER EXTREMITY MMT:    MMT Right eval Left eval RIGHT 6/25 LEFT 6/25  Hip flexion 4+ 5    Hip extension      Hip abduction 3+ 4+    Hip adduction 5 5    Hip internal rotation 5 5    Hip external rotation 4- 5    Knee flexion 5 5    Knee extension 5 5    Ankle dorsiflexion 5 5    Ankle plantarflexion      Ankle inversion      Ankle eversion       (Blank rows = not tested)  KNEE SPECIAL TESTS:  6/25: negative Ober's on Rt, improving strength and stability in Rt SLS clock drill  Laxity present Rt knee with increased neutral zone + Ober's Test on Rt  FUNCTIONAL TESTS:  SLS much more wobbly on Rt side but able to balance   GAIT: Distance walked: within clinic Assistive device utilized: None Level of assistance: Complete Independence Comments: lacks arm swing and trunk rotation, Pt states will hobble if have been sitting too long  TODAY'S TREATMENT:  DATE:  12/04/22: Recumbent bike x 6' L2 PT present to discuss status Trigger Point Dry-Needling  Treatment instructions: Expect mild to moderate muscle soreness. S/S of pneumothorax if dry needled over a lung field, and to seek immediate medical attention should they occur. Patient verbalized understanding of these instructions and education.  Patient Consent Given: Yes Education handout provided: Previously provided Muscles treated: Rt lateral hip piriformis, glut med, glut min Electrical  stimulation performed: No Parameters: N/A Treatment response/outcome: deep ache, twitch, release - improved hip and back pain Discussion about reps, resistance, anti-gravity vs standing hip HEP - goal is to work reps without pain and not having to grab Rt hip within reps   11/26/22: NuStep L5 x 5' PT present to review status Rt hip flexor stretch with knee flexion for quad bias off edge of table 2x30" Seated 6# core series: hip to hip x 20, hip to shoulder x10 each, ear to ear x 10 Sit to stand with 6lb overhead press x10 from chair feet on airex pad  Bwd resistance cable walks 10lb UE handles x8 Standing row 10lb cable 2x10 SLS with single ski pole LE 3-way reach taps x 5 rounds bil Standing T at counter 2x5 bil Sidestepping green loop at ankles 3 laps along counter (no UE support) Seated deadlift with red powercord under feet x10 Supine PPT x 10 Isometric dead bug at 90/90 x 20", add dead bug alt opp hand to knee taps Supine LE scissor x 20 Supine LE bicycle x 20 Supine bil UE horiz abd blue x 15 Seated 3-way raise 2lb bil x 5 rounds Supine bridge 1x15 with 10lb weight plate on anterior pelvis Supine bil UE raise 2lb x10 Prone single UE extension, horiz abd and row 2lb x 8 each, bil  11/22/22: NuStep L5 x 5' PT present to review status Rt hip flexor stretch with knee flexion for quad bias off edge of table 3x30" Sit to stand from elevated table, then from chair height, then squat to table at chair height to work on body mechanics hip hinge with knee flexion  Seated 6# core series:  hip to hip, shoulder to hip diagonals, Vs, ear to ear 5x each Chair sit up with 6lb dumbbell press overhead x 5 Seated pink power cord dead lifts 12x cord under feet Seated pink power cord with floor slider hip abduction 12x right/left Supine alt heel taps to table 3x10 Isometric dead bug at 90/90 x 20", add dead bug alt opp hand to knee taps Supine LE scissor x 20 Supine LE bicycle x 20 SLS with  single ski pole LE 3-way reach taps x 5 rounds bil Bwd resistance cable walks 10lb UE handles x6 Standing row 10lb cable 2x10   PATIENT EDUCATION:  Education details: [email or web address] Person educated: Patient Education method: Programmer, multimedia, Facilities manager, Verbal cues, and Handouts Education comprehension: verbalized understanding and returned demonstration  HOME EXERCISE PROGRAM: Access Code: [email or web address] URL: [email or web address] Date: 11/07/2022 Prepared by: Loistine Simas Hershey Knauer  Exercises - Supine Figure 4 Piriformis Stretch  - 1 x daily - 7 x weekly - 1 sets - 2-3 reps - 20-30 hold - Modified Thomas Stretch  - 1 x daily - 7 x weekly - 1 sets - 2-3 reps - 20-30 hold - Supine ITB Stretch  - 1 x daily - 7 x weekly - 1 sets - 2-3 reps - 20-30 hold - Supine Bridge  - 1 x daily - 7 x weekly - 1 sets - 10 reps - Straight Leg  Raise  - 1 x daily - 7 x weekly - 1 sets - 10 reps - Clamshell with Resistance  - 1 x daily - 7 x weekly - 1 sets - 10 reps - Sidelying Hip Abduction  - 1 x daily - 7 x weekly - 1 sets - 10 reps - Standing Hip Extension with Counter Support  - 1 x daily - 7 x weekly - 1 sets - 10 reps  ASSESSMENT:  CLINICAL IMPRESSION: Pt reports overall improvement by 40%.  Her FOTO score is met today reaching 83%.  She requested repeat DN to Rt hip as she has been having to grab Rt hip within reps due to pain.  Discussion around number of reps, resistance level and positioning (gravity eliminated or against gravity) with goal of HEP to be challenging but without need to grab to support hip.   Pt had good release of Rt hip today with DN and even noted some reduction of LBP with just focusing on Rt hip today.  ERO next time with anticipated extension of POC given ongoing improvements and need for skilled intervention.   OBJECTIVE IMPAIRMENTS: decreased activity tolerance, decreased balance, decreased strength, hypomobility, increased fascial restrictions, increased muscle spasms,  impaired flexibility, impaired tone, improper body mechanics, postural dysfunction, and pain.   ACTIVITY LIMITATIONS: sitting, squatting, sleeping, stairs, and transfers  PARTICIPATION LIMITATIONS: cleaning, laundry, shopping, community activity, and yard work  PERSONAL FACTORS: Time since onset of injury/illness/exacerbation are also affecting patient's functional outcome.   REHAB POTENTIAL: Excellent  CLINICAL DECISION MAKING: Stable/uncomplicated  EVALUATION COMPLEXITY: Low   GOALS: Goals reviewed with patient? Yes  SHORT TERM GOALS: Target date: 11/21/22  Pt will be ind with initial HEP without exacerbation of symptoms. Baseline: Goal status: MET 6/13  2.  Pt will improve quad and ITB length to WNL and have negative Claiborne Rigg and Maisie Fus test Baseline:  Goal status: MET 6/13  VESTIBULAR STG: target date 12/26/22  Will be compliant with appropriate progressive HEP to include appropriate modified exercises for BPPV   Baseline: Goal status: INITIAL   2.  Dizziness to be no more than 1/10 with positional changes  Baseline:  Goal status: INITIAL   3.  Will be able to verbalize signs/symptoms of concern and how to manage orthostatic hypotension on an independent basis  Baseline:  Goal status: INITIAL   LONG TERM GOALS: Target date: 12/19/22  Pt will be ind with advanced HEP and understand how to safely progress recreational activities. Baseline:  Goal status: ongoing  2.  Pt will improve Rt hip and core strength to at least 4+/5 to improve functional strength for daily tasks and recreational activities.  Baseline:  Goal status: ongoing  3.  Pt will be able to bend, lift and squat to allow her to garden. Baseline:  Goal status: INITIAL  4.  Pt will be able to sleep with min pain disruption Baseline:  Goal status: INITIAL  5.  Pt will be able to play golf with min or no pain. Baseline:  Goal status: INITIAL    PLAN:  PT FREQUENCY: 2x/week  PT DURATION: 8  weeks  PLANNED INTERVENTIONS: Therapeutic exercises, Therapeutic activity, Neuromuscular re-education, Balance training, Gait training, Patient/Family education, Self Care, Joint mobilization, Dry Needling, Electrical stimulation, Spinal mobilization, Cryotherapy, Moist heat, Taping, Ionotophoresis 4mg /ml Dexamethasone, and Manual therapy.  PLAN FOR NEXT SESSION:  Adding vestibular treatment - see new goals under STG  ERO with extension, review HEP and progress as needed,  DN as needed  with more focus on therex.   Progress core and Rt hip strength, closed chain control around Rt knee (laxity present from past injury), functional strength and mobility for gardening and golf    Morton Peters, PT 12/04/22 11:01 AM   Phone: [phone] Fax: [phone]  [facility] [address] [place], [place] [address] Phone # [phone] Fax [phone]

## 2022-12-05 ENCOUNTER — Encounter: Payer: Self-pay | Admitting: Physical Therapy

## 2022-12-06 ENCOUNTER — Encounter: Payer: Medicare Other | Admitting: Physical Therapy

## 2022-12-10 ENCOUNTER — Ambulatory Visit: Payer: Medicare Other | Attending: Sports Medicine | Admitting: Physical Therapy

## 2022-12-10 ENCOUNTER — Encounter: Payer: Self-pay | Admitting: Physical Therapy

## 2022-12-10 DIAGNOSIS — G8929 Other chronic pain: Secondary | ICD-10-CM | POA: Insufficient documentation

## 2022-12-10 DIAGNOSIS — R252 Cramp and spasm: Secondary | ICD-10-CM | POA: Insufficient documentation

## 2022-12-10 DIAGNOSIS — M25561 Pain in right knee: Secondary | ICD-10-CM | POA: Insufficient documentation

## 2022-12-10 DIAGNOSIS — R262 Difficulty in walking, not elsewhere classified: Secondary | ICD-10-CM | POA: Diagnosis present

## 2022-12-10 DIAGNOSIS — M25551 Pain in right hip: Secondary | ICD-10-CM | POA: Diagnosis present

## 2022-12-10 DIAGNOSIS — M6281 Muscle weakness (generalized): Secondary | ICD-10-CM | POA: Insufficient documentation

## 2022-12-10 NOTE — Therapy (Signed)
OUTPATIENT PHYSICAL THERAPY TREATMENT NOTE     Patient Name: Abigail Pruitt MRN: [id number] DOB:Feb 13, 1946, 77 y.o., female Today's Date: 12/10/2022  END OF SESSION:  PT End of Session - 12/10/22 0925     Visit Number 11    Date for PT Re-Evaluation 01/21/23    Authorization Type Medicare needs KX at visit 15    Progress Note Due on Visit 20    PT Start Time 0930    PT Stop Time 1015    PT Time Calculation (min) 45 min    Activity Tolerance Patient tolerated treatment well    Behavior During Therapy WFL for tasks assessed/performed                   Past Medical History:  Diagnosis Date   Colon polyps    Family hx of colon cancer    Hot flashes    Severe hot flashes   Hyperlipidemia    Osteoarthritis    History reviewed. No pertinent surgical history. Patient Active Problem List   Diagnosis Date Noted   Other low back pain 10/23/2021   Pain in right hip 10/23/2021      REFERRING PROVIDER: Christena Deem, MD  REFERRING DIAG: [phone] (ICD-10-CM) - Pain in right hip  Rationale for Evaluation and Treatment: Rehabilitation  THERAPY DIAG:  Pain in right hip  Muscle weakness (generalized)  Difficulty in walking, not elsewhere classified  Cramp and spasm  Chronic pain of right knee  ONSET DATE: winter 2023  SUBJECTIVE:                                                                                                                                                                                           SUBJECTIVE STATEMENT: Overall 40% better.   Rt hip got sore after last DN but then much better.  My back is still pretty tight though and is pulling through the Rt side.  Eval: "My skeleton is wearing out."  I have spinal stenosis, I have had L3 and L5 injections recently and it hasn't helped.  In the past, lumbar injections helped my hip but this time it didn't. MRI of Rt hip show some tears.  MD wants me to get stronger to see if it will help. I  get pain into my Rt knee and sometimes into my foot.   I really hope to get more DN again b/c it helped me so much last time.  PERTINENT HISTORY:  Had PT at this clinic in 2023 History of spinal stenosis Plays golf Husband has stage 4 cancer - diagnosed last year after finished  PT  PAIN:  PAIN:   Are you having pain? Yes NPRS scale: 5/10 Rt hip and really tight lumbar area Pain location: lumbar, Rt hip to Rt knee and sometimes to ankle Pain orientation: Right  PAIN TYPE: aching, dull, tight, and burning, travels to knee and lateral calf Pain description: constant  Aggravating factors: at night, can't sleep on Rt side, being too still (sit too long) Relieving factors: staying active/moving, migun bed (38 min massage bed with heated balls), Voltaren, Aleve/Tylenol   PRECAUTIONS: None  WEIGHT BEARING RESTRICTIONS: No  FALLS:  Has patient fallen in last 6 months? No  LIVING ENVIRONMENT: Lives with: lives with their spouse Lives in: House/apartment Stairs: No Has following equipment at home: None  OCCUPATION: retired  PLOF: Independent  PATIENT GOALS: get stronger, get relief, play golf again, sleep better  NEXT MD VISIT: next month   OBJECTIVE:   DIAGNOSTIC FINDINGS:  No imaging available in chart but Pt reports history of spinal stenosis Recent MRI of Rt hip and lumbar - showed tears of Rt hip - Pt will bring reports next time  PATIENT SURVEYS:  12/04/22: FOTO 83% goal met FOTO 58, goal 61  SCREENING FOR RED FLAGS: Bowel or bladder incontinence: No Spinal tumors: No Cauda equina syndrome: No Compression fracture: No Abdominal aneurysm: No  COGNITION: Overall cognitive status: Within functional limits for tasks assessed     SENSATION: Rt ankle tingly if travels down that far  MUSCLE LENGTH: 6/25: quad length WNL bil Limited quad length bil by 30%  POSTURE: decreased lumbar lordosis and decreased thoracic kyphosis  PALPATION: Rt tight and tender: TFL  and ITB, vastus lateralis, tight hamstring, glut min and piriformis Atrophy present through Rt lateral hip Tight lumbar multifidi bil   LUMBAR ROM:  Full trunk ROM without pain  LOWER EXTREMITY ROM:    Full ROM bil throughout   LOWER EXTREMITY MMT:    MMT Right eval Left eval RIGHT 6/25 LEFT 6/25  Hip flexion 4+ 5 5   Hip extension      Hip abduction 3+ 4+ 4   Hip adduction 5 5    Hip internal rotation 5 5    Hip external rotation 4- 5 4+   Knee flexion 5 5    Knee extension 5 5    Ankle dorsiflexion 5 5    Ankle plantarflexion      Ankle inversion      Ankle eversion       (Blank rows = not tested)  KNEE SPECIAL TESTS:  6/25: negative Ober's on Rt, improving strength and stability in Rt SLS clock drill  Laxity present Rt knee with increased neutral zone + Ober's Test on Rt  FUNCTIONAL TESTS:  7/1: able to balance with less wobble x 10" on Rt LE  SLS much more wobbly on Rt side but able to balance   GAIT: Distance walked: within clinic Assistive device utilized: None Level of assistance: Complete Independence Comments: lacks arm swing and trunk rotation, Pt states will hobble if have been sitting too long  TODAY'S TREATMENT:  DATE:  12/10/22: NuStep L5 x5' PT present to discuss status and review goals Supine bridge x10 - getting cramps in bil HS Supine scissor and bicycle x 20 each SL Rt clam yellow loop 3x10 SL Rt hip 2x10 - PT VC and TC for proper set up and range of exercise  Quadruped Rt hip extension 2x10 Sit to stand hold 10lb yellow loop at thighs 2x12 - PT cued neutral pelvis on each rep for standing to avoid anterior thrust of pelvis Trigger Point Dry-Needling  Treatment instructions: Expect mild to moderate muscle soreness. S/S of pneumothorax if dry needled over a lung field, and to seek immediate medical attention should  they occur. Patient verbalized understanding of these instructions and education.  Patient Consent Given: Yes Education handout provided: Previously provided Muscles treated: bil lumbar multif Electrical stimulation performed: No Parameters: N/A Treatment response/outcome: deep ache and elongation  STM Rt lower thoracic paraspinals and QL HEP focus on resisted clam, SL hip abd, and quadruped hip extension added today with TA indraw  12/04/22: Recumbent bike x 6' L2 PT present to discuss status Trigger Point Dry-Needling  Treatment instructions: Expect mild to moderate muscle soreness. S/S of pneumothorax if dry needled over a lung field, and to seek immediate medical attention should they occur. Patient verbalized understanding of these instructions and education.  Patient Consent Given: Yes Education handout provided: Previously provided Muscles treated: Rt lateral hip piriformis, glut med, glut min Electrical stimulation performed: No Parameters: N/A Treatment response/outcome: deep ache, twitch, release - improved hip and back pain Discussion about reps, resistance, anti-gravity vs standing hip HEP - goal is to work reps without pain and not having to grab Rt hip within reps   11/26/22: NuStep L5 x 5' PT present to review status Rt hip flexor stretch with knee flexion for quad bias off edge of table 2x30" Seated 6# core series: hip to hip x 20, hip to shoulder x10 each, ear to ear x 10 Sit to stand with 6lb overhead press x10 from chair feet on airex pad  Bwd resistance cable walks 10lb UE handles x8 Standing row 10lb cable 2x10 SLS with single ski pole LE 3-way reach taps x 5 rounds bil Standing T at counter 2x5 bil Sidestepping green loop at ankles 3 laps along counter (no UE support) Seated deadlift with red powercord under feet x10 Supine PPT x 10 Isometric dead bug at 90/90 x 20", add dead bug alt opp hand to knee taps Supine LE scissor x 20 Supine LE bicycle x  20 Supine bil UE horiz abd blue x 15 Seated 3-way raise 2lb bil x 5 rounds Supine bridge 1x15 with 10lb weight plate on anterior pelvis Supine bil UE raise 2lb x10 Prone single UE extension, horiz abd and row 2lb x 8 each, bil    PATIENT EDUCATION:  Education details: [email or web address] Person educated: Patient Education method: Programmer, multimedia, Facilities manager, Verbal cues, and Handouts Education comprehension: verbalized understanding and returned demonstration  HOME EXERCISE PROGRAM: Access Code: [email or web address] URL: [email or web address] Date: 12/10/2022 Prepared by: Loistine Simas Jahsir Rama  Exercises - Supine Figure 4 Piriformis Stretch  - 1 x daily - 7 x weekly - 1 sets - 2-3 reps - 20-30 hold - Modified Thomas Stretch  - 1 x daily - 7 x weekly - 1 sets - 2-3 reps - 20-30 hold - Supine ITB Stretch  - 1 x daily - 7 x weekly - 1 sets - 2-3 reps - 20-30 hold - Supine Bridge  - 1 x  daily - 7 x weekly - 1 sets - 10 reps - Straight Leg Raise  - 1 x daily - 7 x weekly - 1 sets - 10 reps - Clamshell with Resistance  - 1 x daily - 7 x weekly - 3 sets - 10 reps - Sidelying Hip Abduction  - 1 x daily - 7 x weekly - 3 sets - 10 reps - Standing Hip Extension with Counter Support  - 1 x daily - 7 x weekly - 1 sets - 10 reps - Beginner Front Arm Support  - 1 x daily - 7 x weekly - 3 sets - 10 reps  ASSESSMENT:  CLINICAL IMPRESSION: Pt reports overall improvement by 40%.  Her FOTO score was met last week reaching 83%.  Pt continues to display excellent mobility of trunk and bil LE.  She is making progress on strength of core and Rt hip.  She was able to increase sets of anti-gravity hip therex today without exacerbation of pain.  PT cued smaller range and control in SL hip abd with Pt report of improved awareness of target muscle within reps.  Added quadruped hip extension today with good tolerance and recruitment of glut max so added to HEP.   Pt continues to benefit from TPDN to manage tension and  tightness in lumbar spine and Rt hip.  Pt has also recently been evaluated for dizziness for which she will start treatment visits within planned extension of care. She is making steady progress towards all LTG and new short term vestibular goals and will benefit from continued skilled intervention through PT 1-2x/week for another 6 weeks.     OBJECTIVE IMPAIRMENTS: decreased activity tolerance, decreased balance, decreased strength, hypomobility, increased fascial restrictions, increased muscle spasms, impaired flexibility, impaired tone, improper body mechanics, postural dysfunction, and pain.   ACTIVITY LIMITATIONS: sitting, squatting, sleeping, stairs, and transfers  PARTICIPATION LIMITATIONS: cleaning, laundry, shopping, community activity, and yard work  PERSONAL FACTORS: Time since onset of injury/illness/exacerbation are also affecting patient's functional outcome.   REHAB POTENTIAL: Excellent  CLINICAL DECISION MAKING: Stable/uncomplicated  EVALUATION COMPLEXITY: Low   GOALS: Goals reviewed with patient? Yes  SHORT TERM GOALS: Target date: 11/21/22  Pt will be ind with initial HEP without exacerbation of symptoms. Baseline: Goal status: MET 6/13  2.  Pt will improve quad and ITB length to WNL and have negative Claiborne Rigg and Maisie Fus test Baseline:  Goal status: MET 6/13  VESTIBULAR STG: target date 12/26/22  Will be compliant with appropriate progressive HEP to include appropriate modified exercises for BPPV   Baseline: Goal status: INITIAL   2.  Dizziness to be no more than 1/10 with positional changes  Baseline:  Goal status: INITIAL   3.  Will be able to verbalize signs/symptoms of concern and how to manage orthostatic hypotension on an independent basis  Baseline:  Goal status: INITIAL   LONG TERM GOALS: Target date: 12/19/22  Pt will be ind with advanced HEP and understand how to safely progress recreational activities. Baseline:  Goal status: ongoing  2.  Pt  will improve Rt hip and core strength to at least 4+/5 to improve functional strength for daily tasks and recreational activities.  Baseline:  Goal status: ongoing  3.  Pt will be able to bend, lift and squat to allow her to garden. Baseline:  Goal status: MET 7/1  4.  Pt will be able to sleep with min pain disruption Baseline:  Goal status: SOME NIGHTS ARE BETTER 7/1  5.  Pt will be able to play golf with min or no pain. Baseline:  Goal status: HASN'T TRIED YET    PLAN:  PT FREQUENCY: 2x/week for orthopedic and vestibular PT  PT DURATION: 6 weeks  PLANNED INTERVENTIONS: Therapeutic exercises, Therapeutic activity, Neuromuscular re-education, Balance training, Gait training, Patient/Family education, Self Care, Joint mobilization, Dry Needling, Electrical stimulation, Spinal mobilization, Cryotherapy, Moist heat, Taping, Ionotophoresis 4mg /ml Dexamethasone, and Manual therapy.  PLAN FOR NEXT SESSION:  Adding vestibular treatment - see new goals under STG  review HEP and progress as needed,  DN as needed to Rt hip and lumbar spine with more focus on therex.   Progress core and Rt hip strength, closed chain control around Rt knee (laxity present from past injury), functional strength and mobility for gardening and golf    Morton Peters, PT 12/10/22 1:45 PM    Phone: [phone] Fax: [phone]  [facility] [address], [address] [place], [place] [address] Phone # [phone] Fax [phone]

## 2022-12-18 ENCOUNTER — Ambulatory Visit: Payer: Medicare Other

## 2022-12-18 DIAGNOSIS — R262 Difficulty in walking, not elsewhere classified: Secondary | ICD-10-CM

## 2022-12-18 DIAGNOSIS — G8929 Other chronic pain: Secondary | ICD-10-CM

## 2022-12-18 DIAGNOSIS — R252 Cramp and spasm: Secondary | ICD-10-CM

## 2022-12-18 DIAGNOSIS — M25551 Pain in right hip: Secondary | ICD-10-CM

## 2022-12-18 DIAGNOSIS — M6281 Muscle weakness (generalized): Secondary | ICD-10-CM

## 2022-12-18 NOTE — Therapy (Signed)
OUTPATIENT PHYSICAL THERAPY TREATMENT NOTE     Patient Name: Abigail Pruitt MRN: [id number] DOB:03/23/1946, 77 y.o., female Today's Date: 12/18/2022  END OF SESSION:  PT End of Session - 12/18/22 0847     Visit Number 12    Date for PT Re-Evaluation 01/21/23    Authorization Type Medicare needs KX at visit 15    PT Start Time 0847    PT Stop Time 0930    PT Time Calculation (min) 43 min    Activity Tolerance Patient tolerated treatment well    Behavior During Therapy [facility] for tasks assessed/performed                   Past Medical History:  Diagnosis Date   Colon polyps    Family hx of colon cancer    Hot flashes    Severe hot flashes   Hyperlipidemia    Osteoarthritis    History reviewed. No pertinent surgical history. Patient Active Problem List   Diagnosis Date Noted   Other low back pain 10/23/2021   Pain in right hip 10/23/2021      REFERRING PROVIDER: Christena Deem, MD  REFERRING DIAG: [phone] (ICD-10-CM) - Pain in right hip  Rationale for Evaluation and Treatment: Rehabilitation  THERAPY DIAG:  Pain in right hip  Muscle weakness (generalized)  Difficulty in walking, not elsewhere classified  Cramp and spasm  Chronic pain of right knee  ONSET DATE: winter 2023  SUBJECTIVE:                                                                                                                                                                                           SUBJECTIVE STATEMENT: Patient reports right hip and back pain is 2/10.  "I feel like the hip and back are as best as its going to get"  Eval: "My skeleton is wearing out."  I have spinal stenosis, I have had L3 and L5 injections recently and it hasn't helped.  In the past, lumbar injections helped my hip but this time it didn't. MRI of Rt hip show some tears.  MD wants me to get stronger to see if it will help. I get pain into my Rt knee and sometimes into my foot.   I really hope  to get more DN again b/c it helped me so much last time.  PERTINENT HISTORY:  Had PT at this clinic in 2023 History of spinal stenosis Plays golf Husband has stage 4 cancer - diagnosed last year after finished PT  PAIN:  PAIN:   Are you having pain? Yes NPRS scale: 5/10  Rt hip and really tight lumbar area Pain location: lumbar, Rt hip to Rt knee and sometimes to ankle Pain orientation: Right  PAIN TYPE: aching, dull, tight, and burning, travels to knee and lateral calf Pain description: constant  Aggravating factors: at night, can't sleep on Rt side, being too still (sit too long) Relieving factors: staying active/moving, migun bed (38 min massage bed with heated balls), Voltaren, Aleve/Tylenol   PRECAUTIONS: None  WEIGHT BEARING RESTRICTIONS: No  FALLS:  Has patient fallen in last 6 months? No  LIVING ENVIRONMENT: Lives with: lives with their spouse Lives in: House/apartment Stairs: No Has following equipment at home: None  OCCUPATION: retired  PLOF: Independent  PATIENT GOALS: get stronger, get relief, play golf again, sleep better  NEXT MD VISIT: next month   OBJECTIVE:   DIAGNOSTIC FINDINGS:  No imaging available in chart but Pt reports history of spinal stenosis Recent MRI of Rt hip and lumbar - showed tears of Rt hip - Pt will bring reports next time  PATIENT SURVEYS:  12/04/22: FOTO 83% goal met FOTO 58, goal 61  SCREENING FOR RED FLAGS: Bowel or bladder incontinence: No Spinal tumors: No Cauda equina syndrome: No Compression fracture: No Abdominal aneurysm: No  COGNITION: Overall cognitive status: Within functional limits for tasks assessed     SENSATION: Rt ankle tingly if travels down that far  MUSCLE LENGTH: 6/25: quad length WNL bil Limited quad length bil by 30%  POSTURE: decreased lumbar lordosis and decreased thoracic kyphosis  PALPATION: Rt tight and tender: TFL and ITB, vastus lateralis, tight hamstring, glut min and  piriformis Atrophy present through Rt lateral hip Tight lumbar multifidi bil   LUMBAR ROM:  Full trunk ROM without pain  LOWER EXTREMITY ROM:    Full ROM bil throughout   LOWER EXTREMITY MMT:    MMT Right eval Left eval RIGHT 6/25 LEFT 6/25  Hip flexion 4+ 5 5   Hip extension      Hip abduction 3+ 4+ 4   Hip adduction 5 5    Hip internal rotation 5 5    Hip external rotation 4- 5 4+   Knee flexion 5 5    Knee extension 5 5    Ankle dorsiflexion 5 5    Ankle plantarflexion      Ankle inversion      Ankle eversion       (Blank rows = not tested)  KNEE SPECIAL TESTS:  6/25: negative Ober's on Rt, improving strength and stability in Rt SLS clock drill  Laxity present Rt knee with increased neutral zone + Ober's Test on Rt  FUNCTIONAL TESTS:  7/1: able to balance with less wobble x 10" on Rt LE  SLS much more wobbly on Rt side but able to balance   GAIT: Distance walked: within clinic Assistive device utilized: None Level of assistance: Complete Independence Comments: lacks arm swing and trunk rotation, Pt states will hobble if have been sitting too long  TODAY'S TREATMENT:  DATE:  12/18/22: NuStep L5 x5' PT present to discuss status and review goals Supine bridge 2 x 10 (suggested pressure through ball of foot to avoid cramps) Supine bridge with clam 2 x 10 with yellow loop Side lying clam with yellow loop both Supine scissor and bicycle x 20 each SL hip 2x10 - VC for proper set up and range of exercise (both) Prone bilateral hip extension 2x10 Sit to stand hold 10lb yellow loop at thighs 2 x 10 - PT cued neutral pelvis on each rep for standing to avoid anterior thrust of pelvis Squat to table 2 x 10 with 10 lb kb Lateral band walks with yellow loop x 3 laps of 15 feet Standing Hip ER with blue band 2 x 10 (tied tight) Side lying hip IR  with yellow loop with balance pad between knees Seated LAQ with 2 1/2 lbs x 20 each Seated up and over hurdle 2 x 10 with 2 1/2 lbs   12/10/22: NuStep L5 x5' PT present to discuss status and review goals Supine bridge x10 - getting cramps in bil HS Supine scissor and bicycle x 20 each SL Rt clam yellow loop 3x10 SL Rt hip 2x10 - PT VC and TC for proper set up and range of exercise  Quadruped Rt hip extension 2x10 Sit to stand hold 10lb yellow loop at thighs 2x12 - PT cued neutral pelvis on each rep for standing to avoid anterior thrust of pelvis Trigger Point Dry-Needling  Treatment instructions: Expect mild to moderate muscle soreness. S/S of pneumothorax if dry needled over a lung field, and to seek immediate medical attention should they occur. Patient verbalized understanding of these instructions and education.  Patient Consent Given: Yes Education handout provided: Previously provided Muscles treated: bil lumbar multif Electrical stimulation performed: No Parameters: N/A Treatment response/outcome: deep ache and elongation  STM Rt lower thoracic paraspinals and QL HEP focus on resisted clam, SL hip abd, and quadruped hip extension added today with TA indraw  12/04/22: Recumbent bike x 6' L2 PT present to discuss status Trigger Point Dry-Needling  Treatment instructions: Expect mild to moderate muscle soreness. S/S of pneumothorax if dry needled over a lung field, and to seek immediate medical attention should they occur. Patient verbalized understanding of these instructions and education.  Patient Consent Given: Yes Education handout provided: Previously provided Muscles treated: Rt lateral hip piriformis, glut med, glut min Electrical stimulation performed: No Parameters: N/A Treatment response/outcome: deep ache, twitch, release - improved hip and back pain Discussion about reps, resistance, anti-gravity vs standing hip HEP - goal is to work reps without pain and not having  to grab Rt hip within reps   11/26/22: NuStep L5 x 5' PT present to review status Rt hip flexor stretch with knee flexion for quad bias off edge of table 2x30" Seated 6# core series: hip to hip x 20, hip to shoulder x10 each, ear to ear x 10 Sit to stand with 6lb overhead press x10 from chair feet on airex pad  Bwd resistance cable walks 10lb UE handles x8 Standing row 10lb cable 2x10 SLS with single ski pole LE 3-way reach taps x 5 rounds bil Standing T at counter 2x5 bil Sidestepping green loop at ankles 3 laps along counter (no UE support) Seated deadlift with red powercord under feet x10 Supine PPT x 10 Isometric dead bug at 90/90 x 20", add dead bug alt opp hand to knee taps Supine LE scissor x 20 Supine LE bicycle x  20 Supine bil UE horiz abd blue x 15 Seated 3-way raise 2lb bil x 5 rounds Supine bridge 1x15 with 10lb weight plate on anterior pelvis Supine bil UE raise 2lb x10 Prone single UE extension, horiz abd and row 2lb x 8 each, bil    PATIENT EDUCATION:  Education details: [email or web address] Person educated: Patient Education method: Programmer, multimedia, Facilities manager, Verbal cues, and Handouts Education comprehension: verbalized understanding and returned demonstration  HOME EXERCISE PROGRAM: Access Code: [email or web address] URL: [email or web address] Date: 12/10/2022 Prepared by: Loistine Simas Beuhring  Exercises - Supine Figure 4 Piriformis Stretch  - 1 x daily - 7 x weekly - 1 sets - 2-3 reps - 20-30 hold - Modified Thomas Stretch  - 1 x daily - 7 x weekly - 1 sets - 2-3 reps - 20-30 hold - Supine ITB Stretch  - 1 x daily - 7 x weekly - 1 sets - 2-3 reps - 20-30 hold - Supine Bridge  - 1 x daily - 7 x weekly - 1 sets - 10 reps - Straight Leg Raise  - 1 x daily - 7 x weekly - 1 sets - 10 reps - Clamshell with Resistance  - 1 x daily - 7 x weekly - 3 sets - 10 reps - Sidelying Hip Abduction  - 1 x daily - 7 x weekly - 3 sets - 10 reps - Standing Hip Extension with Counter  Support  - 1 x daily - 7 x weekly - 1 sets - 10 reps - Beginner Front Arm Support  - 1 x daily - 7 x weekly - 3 sets - 10 reps  ASSESSMENT:  CLINICAL IMPRESSION: Abigail Pruitt is progressing appropriately. Her hip pain is well controlled.  She reports that her vertigo seems to have calmed down a little.  She was able to complete all tasks today with ease.  She should continue to do well.   She is making steady progress towards all LTG and new short term vestibular goals and will benefit from continued skilled intervention through PT 1-2x/week for another 6 weeks.     OBJECTIVE IMPAIRMENTS: decreased activity tolerance, decreased balance, decreased strength, hypomobility, increased fascial restrictions, increased muscle spasms, impaired flexibility, impaired tone, improper body mechanics, postural dysfunction, and pain.   ACTIVITY LIMITATIONS: sitting, squatting, sleeping, stairs, and transfers  PARTICIPATION LIMITATIONS: cleaning, laundry, shopping, community activity, and yard work  PERSONAL FACTORS: Time since onset of injury/illness/exacerbation are also affecting patient's functional outcome.   REHAB POTENTIAL: Excellent  CLINICAL DECISION MAKING: Stable/uncomplicated  EVALUATION COMPLEXITY: Low   GOALS: Goals reviewed with patient? Yes  SHORT TERM GOALS: Target date: 11/21/22  Pt will be ind with initial HEP without exacerbation of symptoms. Baseline: Goal status: MET 6/13  2.  Pt will improve quad and ITB length to WNL and have negative Claiborne Rigg and Maisie Fus test Baseline:  Goal status: MET 6/13  VESTIBULAR STG: target date 12/26/22  Will be compliant with appropriate progressive HEP to include appropriate modified exercises for BPPV   Baseline: Goal status: INITIAL   2.  Dizziness to be no more than 1/10 with positional changes  Baseline:  Goal status: INITIAL   3.  Will be able to verbalize signs/symptoms of concern and how to manage orthostatic hypotension on an independent  basis  Baseline:  Goal status: INITIAL   LONG TERM GOALS: Target date: 12/19/22  Pt will be ind with advanced HEP and understand how to safely progress recreational activities. Baseline:  Goal status: ongoing  2.  Pt will improve  Rt hip and core strength to at least 4+/5 to improve functional strength for daily tasks and recreational activities.  Baseline:  Goal status: ongoing  3.  Pt will be able to bend, lift and squat to allow her to garden. Baseline:  Goal status: MET 7/1  4.  Pt will be able to sleep with min pain disruption Baseline:  Goal status: SOME NIGHTS ARE BETTER 7/1  5.  Pt will be able to play golf with min or no pain. Baseline:  Goal status: HASN'T TRIED YET    PLAN:  PT FREQUENCY: 2x/week for orthopedic and vestibular PT  PT DURATION: 6 weeks  PLANNED INTERVENTIONS: Therapeutic exercises, Therapeutic activity, Neuromuscular re-education, Balance training, Gait training, Patient/Family education, Self Care, Joint mobilization, Dry Needling, Electrical stimulation, Spinal mobilization, Cryotherapy, Moist heat, Taping, Ionotophoresis 4mg /ml Dexamethasone, and Manual therapy.  PLAN FOR NEXT SESSION:  Adding vestibular treatment - see new goals under STG  review HEP and progress as needed,  DN as needed to Rt hip and lumbar spine with more focus on therex.   Progress core and Rt hip strength, closed chain control around Rt knee (laxity present from past injury), functional strength and mobility for gardening and golf    Abigail Pruitt, PT 12/18/22 9:52 AM [facility] [address] [place], [place] [address] Phone # [phone] Fax [phone]

## 2022-12-19 ENCOUNTER — Ambulatory Visit: Payer: Medicare Other | Admitting: Rehabilitative and Restorative Service Providers"

## 2022-12-24 ENCOUNTER — Ambulatory Visit: Payer: Medicare Other | Admitting: Rehabilitative and Restorative Service Providers"

## 2022-12-24 ENCOUNTER — Encounter: Payer: Self-pay | Admitting: Rehabilitative and Restorative Service Providers"

## 2022-12-24 DIAGNOSIS — M25551 Pain in right hip: Secondary | ICD-10-CM

## 2022-12-24 DIAGNOSIS — R252 Cramp and spasm: Secondary | ICD-10-CM

## 2022-12-24 DIAGNOSIS — G8929 Other chronic pain: Secondary | ICD-10-CM

## 2022-12-24 DIAGNOSIS — M6281 Muscle weakness (generalized): Secondary | ICD-10-CM

## 2022-12-24 NOTE — Therapy (Signed)
OUTPATIENT PHYSICAL THERAPY TREATMENT NOTE     Patient Name: Abigail CINNAMON MRN: [id number] DOB:December 18, 1945, 77 y.o., female Today's Date: 12/24/2022  END OF SESSION:  PT End of Session - 12/24/22 1150     Visit Number 13    Date for PT Re-Evaluation 01/21/23    Authorization Type Medicare needs KX at visit 15    Progress Note Due on Visit 20    PT Start Time 1143    PT Stop Time 1225    PT Time Calculation (min) 42 min    Activity Tolerance Patient tolerated treatment well    Behavior During Therapy WFL for tasks assessed/performed                   Past Medical History:  Diagnosis Date   Colon polyps    Family hx of colon cancer    Hot flashes    Severe hot flashes   Hyperlipidemia    Osteoarthritis    History reviewed. No pertinent surgical history. Patient Active Problem List   Diagnosis Date Noted   Other low back pain 10/23/2021   Pain in right hip 10/23/2021      REFERRING PROVIDER: Christena Deem, MD  REFERRING DIAG: [phone] (ICD-10-CM) - Pain in right hip  Rationale for Evaluation and Treatment: Rehabilitation  THERAPY DIAG:  Pain in right hip  Muscle weakness (generalized)  Cramp and spasm  Chronic pain of right knee  ONSET DATE: winter 2023  SUBJECTIVE:                                                                                                                                                                                           SUBJECTIVE STATEMENT: Pt denies any pain or dizziness today.  States that she has not been having any dizziness with change in positions now.  Eval: "My skeleton is wearing out."  I have spinal stenosis, I have had L3 and L5 injections recently and it hasn't helped.  In the past, lumbar injections helped my hip but this time it didn't. MRI of Rt hip show some tears.  MD wants me to get stronger to see if it will help. I get pain into my Rt knee and sometimes into my foot.   I really hope to get  more DN again b/c it helped me so much last time.  PERTINENT HISTORY:  Had PT at this clinic in 2023 History of spinal stenosis Plays golf Husband has stage 4 cancer - diagnosed last year after finished PT  PAIN:  PAIN:   Are you having pain? Yes NPRS scale: 0/10  Pain location: lumbar, Rt hip to Rt knee and sometimes to ankle Pain orientation: Right  PAIN TYPE: aching, dull, tight, and burning, travels to knee and lateral calf Pain description: constant  Aggravating factors: at night, can't sleep on Rt side, being too still (sit too long) Relieving factors: staying active/moving, migun bed (38 min massage bed with heated balls), Voltaren, Aleve/Tylenol   PRECAUTIONS: None  WEIGHT BEARING RESTRICTIONS: No  FALLS:  Has patient fallen in last 6 months? No  LIVING ENVIRONMENT: Lives with: lives with their spouse Lives in: House/apartment Stairs: No Has following equipment at home: None  OCCUPATION: retired  PLOF: Independent  PATIENT GOALS: get stronger, get relief, play golf again, sleep better  NEXT MD VISIT: next month   OBJECTIVE:   DIAGNOSTIC FINDINGS:  No imaging available in chart but Pt reports history of spinal stenosis Recent MRI of Rt hip and lumbar - showed tears of Rt hip - Pt will bring reports next time  PATIENT SURVEYS:  12/04/22: FOTO 83% goal met FOTO 58, goal 61  SCREENING FOR RED FLAGS: Bowel or bladder incontinence: No Spinal tumors: No Cauda equina syndrome: No Compression fracture: No Abdominal aneurysm: No  COGNITION: Overall cognitive status: Within functional limits for tasks assessed     SENSATION: Rt ankle tingly if travels down that far  MUSCLE LENGTH: 6/25: quad length WNL bil Limited quad length bil by 30%  POSTURE: decreased lumbar lordosis and decreased thoracic kyphosis  PALPATION: Rt tight and tender: TFL and ITB, vastus lateralis, tight hamstring, glut min and piriformis Atrophy present through Rt lateral  hip Tight lumbar multifidi bil   LUMBAR ROM:  Full trunk ROM without pain  LOWER EXTREMITY ROM:    Full ROM bil throughout   LOWER EXTREMITY MMT:    MMT Right eval Left eval RIGHT 6/25 LEFT 6/25  Hip flexion 4+ 5 5   Hip extension      Hip abduction 3+ 4+ 4   Hip adduction 5 5    Hip internal rotation 5 5    Hip external rotation 4- 5 4+   Knee flexion 5 5    Knee extension 5 5    Ankle dorsiflexion 5 5    Ankle plantarflexion      Ankle inversion      Ankle eversion       (Blank rows = not tested)  KNEE SPECIAL TESTS:  6/25: negative Ober's on Rt, improving strength and stability in Rt SLS clock drill  Laxity present Rt knee with increased neutral zone + Ober's Test on Rt  FUNCTIONAL TESTS:  7/1: able to balance with less wobble x 10" on Rt LE  SLS much more wobbly on Rt side but able to balance   GAIT: Distance walked: within clinic Assistive device utilized: None Level of assistance: Complete Independence Comments: lacks arm swing and trunk rotation, Pt states will hobble if have been sitting too long  TODAY'S TREATMENT:  DATE: 12/24/2022 Dix-Hallpike negative bilateral sides Nustep level 5 x5 min with PT present to discuss status Supine bridge 2x10 Supine bridge with clam 2 x 10 with yellow loop (with cuing to decrease velocity) Side-lying clamshell with green tband 2x10 bilat Seated with 3#:  LAQ, hip ER.  2x10 each bilat Standing squat to mat (with brief tap) 2x10 Standing at barre (to have as support, if needed) performing unilateral foot tap to each side of hurdle 2x10 each bilat Standing forward T at barre x10 bilat Walking down PT gym hallway performing head/eye rotation and head nods x2 lengths of hallway each Marching on mini-trampoline x2 min   DATE: 12/18/22: NuStep L5 x5' PT present to discuss status and review  goals Supine bridge 2 x 10 (suggested pressure through ball of foot to avoid cramps) Supine bridge with clam 2 x 10 with yellow loop Side lying clam with yellow loop both Supine scissor and bicycle x 20 each SL hip 2x10 - VC for proper set up and range of exercise (both) Prone bilateral hip extension 2x10 Sit to stand hold 10lb yellow loop at thighs 2 x 10 - PT cued neutral pelvis on each rep for standing to avoid anterior thrust of pelvis Squat to table 2 x 10 with 10 lb kb Lateral band walks with yellow loop x 3 laps of 15 feet Standing Hip ER with blue band 2 x 10 (tied tight) Side lying hip IR with yellow loop with balance pad between knees Seated LAQ with 2 1/2 lbs x 20 each Seated up and over hurdle 2 x 10 with 2 1/2 lbs   12/10/22: NuStep L5 x5' PT present to discuss status and review goals Supine bridge x10 - getting cramps in bil HS Supine scissor and bicycle x 20 each SL Rt clam yellow loop 3x10 SL Rt hip 2x10 - PT VC and TC for proper set up and range of exercise  Quadruped Rt hip extension 2x10 Sit to stand hold 10lb yellow loop at thighs 2x12 - PT cued neutral pelvis on each rep for standing to avoid anterior thrust of pelvis Trigger Point Dry-Needling  Treatment instructions: Expect mild to moderate muscle soreness. S/S of pneumothorax if dry needled over a lung field, and to seek immediate medical attention should they occur. Patient verbalized understanding of these instructions and education.  Patient Consent Given: Yes Education handout provided: Previously provided Muscles treated: bil lumbar multif Electrical stimulation performed: No Parameters: N/A Treatment response/outcome: deep ache and elongation  STM Rt lower thoracic paraspinals and QL HEP focus on resisted clam, SL hip abd, and quadruped hip extension added today with TA indraw    PATIENT EDUCATION:  Education details: [email or web address] Person educated: Patient Education method: Programmer, multimedia, Demonstration,  Verbal cues, and Handouts Education comprehension: verbalized understanding and returned demonstration  HOME EXERCISE PROGRAM: Access Code: [email or web address] URL: [email or web address] Date: 12/10/2022 Prepared by: Loistine Simas Beuhring  Exercises - Supine Figure 4 Piriformis Stretch  - 1 x daily - 7 x weekly - 1 sets - 2-3 reps - 20-30 hold - Modified Thomas Stretch  - 1 x daily - 7 x weekly - 1 sets - 2-3 reps - 20-30 hold - Supine ITB Stretch  - 1 x daily - 7 x weekly - 1 sets - 2-3 reps - 20-30 hold - Supine Bridge  - 1 x daily - 7 x weekly - 1 sets - 10 reps - Straight Leg Raise  - 1 x daily - 7 x weekly - 1 sets -  10 reps - Clamshell with Resistance  - 1 x daily - 7 x weekly - 3 sets - 10 reps - Sidelying Hip Abduction  - 1 x daily - 7 x weekly - 3 sets - 10 reps - Standing Hip Extension with Counter Support  - 1 x daily - 7 x weekly - 1 sets - 10 reps - Beginner Front Arm Support  - 1 x daily - 7 x weekly - 3 sets - 10 reps  ASSESSMENT:  CLINICAL IMPRESSION: Ms Siglin presents to PT session for vestibular reassessment.  Patient states that she has not had any additional dizziness in the past week.  Patient with negative Dix-Hallpike on bilateral sides.  Patient denies any dizziness with change of position during session today.  Patient able to progress through session without any reports of increased hip pain.  Did need to provide some cuing with seated exercises on mat to avoid strain on back.  Patient with no dizziness reported with head motion with ambulation.  All vestibular goals met at this time and all vestibular appointments cancelled with patient to continue with orthopedic appointments.    OBJECTIVE IMPAIRMENTS: decreased activity tolerance, decreased balance, decreased strength, hypomobility, increased fascial restrictions, increased muscle spasms, impaired flexibility, impaired tone, improper body mechanics, postural dysfunction, and pain.   ACTIVITY LIMITATIONS: sitting,  squatting, sleeping, stairs, and transfers  PARTICIPATION LIMITATIONS: cleaning, laundry, shopping, community activity, and yard work  PERSONAL FACTORS: Time since onset of injury/illness/exacerbation are also affecting patient's functional outcome.   REHAB POTENTIAL: Excellent  CLINICAL DECISION MAKING: Stable/uncomplicated  EVALUATION COMPLEXITY: Low   GOALS: Goals reviewed with patient? Yes  SHORT TERM GOALS: Target date: 11/21/22  Pt will be ind with initial HEP without exacerbation of symptoms. Baseline: Goal status: MET 6/13  2.  Pt will improve quad and ITB length to WNL and have negative Claiborne Rigg and Maisie Fus test Baseline:  Goal status: MET 6/13  VESTIBULAR STG: target date 12/26/22  Will be compliant with appropriate progressive HEP to include appropriate modified exercises for BPPV   Baseline: Goal status: MET on 12/24/2022   2.  Dizziness to be no more than 1/10 with positional changes  Baseline:  Goal status: MET on 12/24/2022   3.  Will be able to verbalize signs/symptoms of concern and how to manage orthostatic hypotension on an independent basis  Baseline:  Goal status: MET on 12/24/2022   LONG TERM GOALS: Target date: 12/19/22  Pt will be ind with advanced HEP and understand how to safely progress recreational activities. Baseline:  Goal status: ongoing  2.  Pt will improve Rt hip and core strength to at least 4+/5 to improve functional strength for daily tasks and recreational activities.  Baseline:  Goal status: ongoing  3.  Pt will be able to bend, lift and squat to allow her to garden. Baseline:  Goal status: MET 7/1  4.  Pt will be able to sleep with min pain disruption Baseline:  Goal status: SOME NIGHTS ARE BETTER 7/1  5.  Pt will be able to play golf with min or no pain. Baseline:  Goal status: HASN'T TRIED YET    PLAN:  PT FREQUENCY: 1-2x/week   PT DURATION: 6 weeks  PLANNED INTERVENTIONS: Therapeutic exercises, Therapeutic  activity, Neuromuscular re-education, Balance training, Gait training, Patient/Family education, Self Care, Joint mobilization, Dry Needling, Electrical stimulation, Spinal mobilization, Cryotherapy, Moist heat, Taping, Ionotophoresis 4mg /ml Dexamethasone, and Manual therapy.  PLAN FOR NEXT SESSION:  review HEP and progress as needed,  DN as needed to Rt hip and lumbar spine with more focus on therex.   Progress core and Rt hip strength, closed chain control around Rt knee (laxity present from past injury), functional strength and mobility for gardening and golf Pt no longer needs vestibular appointments.   Reather Laurence, PT, DPT 12/24/22, 12:40 PM   [facility] [address] [place], [place] [address] Phone # [phone] Fax [phone]

## 2022-12-25 ENCOUNTER — Encounter: Payer: Medicare Other | Admitting: Physical Therapy

## 2022-12-27 ENCOUNTER — Encounter: Payer: Medicare Other | Admitting: Rehabilitative and Restorative Service Providers"

## 2023-01-01 ENCOUNTER — Encounter: Payer: Medicare Other | Admitting: Rehabilitative and Restorative Service Providers"

## 2023-01-02 ENCOUNTER — Ambulatory Visit: Payer: Medicare Other | Admitting: Physical Therapy

## 2023-01-02 DIAGNOSIS — M25551 Pain in right hip: Secondary | ICD-10-CM | POA: Diagnosis not present

## 2023-01-02 DIAGNOSIS — M6281 Muscle weakness (generalized): Secondary | ICD-10-CM

## 2023-01-02 DIAGNOSIS — R252 Cramp and spasm: Secondary | ICD-10-CM

## 2023-01-02 NOTE — Therapy (Signed)
OUTPATIENT PHYSICAL THERAPY TREATMENT NOTE     Patient Name: Abigail Pruitt MRN: [id number] DOB:20-Dec-1945, 77 y.o., female Today's Date: 01/02/2023  END OF SESSION:  PT End of Session - 01/02/23 1107     Visit Number 14    Date for PT Re-Evaluation 01/21/23    Authorization Type Medicare needs KX at visit 15    Progress Note Due on Visit 20    PT Start Time 1100    PT Stop Time 1135    PT Time Calculation (min) 35 min    Activity Tolerance Patient tolerated treatment well                   Past Medical History:  Diagnosis Date   Colon polyps    Family hx of colon cancer    Hot flashes    Severe hot flashes   Hyperlipidemia    Osteoarthritis    No past surgical history on file. Patient Active Problem List   Diagnosis Date Noted   Other low back pain 10/23/2021   Pain in right hip 10/23/2021      REFERRING PROVIDER: Christena Deem, MD  REFERRING DIAG: [phone] (ICD-10-CM) - Pain in right hip  Rationale for Evaluation and Treatment: Rehabilitation  THERAPY DIAG:  Pain in right hip  Muscle weakness (generalized)  Cramp and spasm  ONSET DATE: winter 2023  SUBJECTIVE:                                                                                                                                                                                           SUBJECTIVE STATEMENT: I feel pulling down the right side of my back today and right (posterior) hip.  Usually I can stretch it out but today I can't get it to release.  Wrist pain right, I can't do the ex's on my hands and knees. I do about 30 minutes of exercises at a time. No more dizziness.   Eval: "My skeleton is wearing out."  I have spinal stenosis, I have had L3 and L5 injections recently and it hasn't helped.  In the past, lumbar injections helped my hip but this time it didn't. MRI of Rt hip show some tears.  MD wants me to get stronger to see if it will help. I get pain into my Rt knee and  sometimes into my foot.   I really hope to get more DN again b/c it helped me so much last time.  PERTINENT HISTORY:  Had PT at this clinic in 2023 History of spinal stenosis Plays golf Husband has stage 4 cancer - diagnosed  last year after finished PT  PAIN:  PAIN:   Are you having pain? Yes NPRS scale: 2/10  Pain location: lumbar, Rt hip to Rt knee and sometimes to ankle Pain orientation: Right  PAIN TYPE: aching, dull, tight, and burning, travels to knee and lateral calf Pain description: constant  Aggravating factors: at night, can't sleep on Rt side, being too still (sit too long) Relieving factors: staying active/moving, migun bed (38 min massage bed with heated balls), Voltaren, Aleve/Tylenol   PRECAUTIONS: None  WEIGHT BEARING RESTRICTIONS: No  FALLS:  Has patient fallen in last 6 months? No  LIVING ENVIRONMENT: Lives with: lives with their spouse Lives in: House/apartment Stairs: No Has following equipment at home: None  OCCUPATION: retired  PLOF: Independent  PATIENT GOALS: get stronger, get relief, play golf again, sleep better  NEXT MD VISIT: next month   OBJECTIVE:   DIAGNOSTIC FINDINGS:  No imaging available in chart but Pt reports history of spinal stenosis Recent MRI of Rt hip and lumbar - showed tears of Rt hip - Pt will bring reports next time  PATIENT SURVEYS:  12/04/22: FOTO 83% goal met FOTO 58, goal 61  SCREENING FOR RED FLAGS: Bowel or bladder incontinence: No Spinal tumors: No Cauda equina syndrome: No Compression fracture: No Abdominal aneurysm: No  COGNITION: Overall cognitive status: Within functional limits for tasks assessed     SENSATION: Rt ankle tingly if travels down that far  MUSCLE LENGTH: 6/25: quad length WNL bil Limited quad length bil by 30%  POSTURE: decreased lumbar lordosis and decreased thoracic kyphosis  PALPATION: Rt tight and tender: TFL and ITB, vastus lateralis, tight hamstring, glut min and  piriformis Atrophy present through Rt lateral hip Tight lumbar multifidi bil   LUMBAR ROM:  Full trunk ROM without pain  LOWER EXTREMITY ROM:    Full ROM bil throughout   LOWER EXTREMITY MMT:    MMT Right eval Left eval RIGHT 6/25 LEFT 6/25  Hip flexion 4+ 5 5   Hip extension      Hip abduction 3+ 4+ 4   Hip adduction 5 5    Hip internal rotation 5 5    Hip external rotation 4- 5 4+   Knee flexion 5 5    Knee extension 5 5    Ankle dorsiflexion 5 5    Ankle plantarflexion      Ankle inversion      Ankle eversion       (Blank rows = not tested)  KNEE SPECIAL TESTS:  6/25: negative Ober's on Rt, improving strength and stability in Rt SLS clock drill  Laxity present Rt knee with increased neutral zone + Ober's Test on Rt  FUNCTIONAL TESTS:  7/1: able to balance with less wobble x 10" on Rt LE  SLS much more wobbly on Rt side but able to balance   GAIT: Distance walked: within clinic Assistive device utilized: None Level of assistance: Complete Independence Comments: lacks arm swing and trunk rotation, Pt states will hobble if have been sitting too long  TODAY'S TREATMENT:  01/02/23: NuStep L5 x5' PT present to discuss status  Review of current HEP: unable to do quadruped position secondary to wrist pain, good compliance with ex about 30 min total Manual therapy: soft tissue mobilization to bil lumbar paraspinals and right gluteals Trigger Point Dry-Needling  Treatment instructions: Expect mild to moderate muscle soreness. S/S of pneumothorax if dry needled over a lung field, and to seek immediate medical attention should they occur. Patient verbalized understanding of these instructions and education.  Patient Consent Given: Yes Education handout provided: Previously provided Muscles treated: bil lumbar multifidi marination, right  gluteals in prone Electrical stimulation performed: No Parameters: N/A Treatment response/outcome: deep ache and elongation   DATE: 12/24/2022 Dix-Hallpike negative bilateral sides Nustep level 5 x5 min with PT present to discuss status Supine bridge 2x10 Supine bridge with clam 2 x 10 with yellow loop (with cuing to decrease velocity) Side-lying clamshell with green tband 2x10 bilat Seated with 3#:  LAQ, hip ER.  2x10 each bilat Standing squat to mat (with brief tap) 2x10 Standing at barre (to have as support, if needed) performing unilateral foot tap to each side of hurdle 2x10 each bilat Standing forward T at barre x10 bilat Walking down PT gym hallway performing head/eye rotation and head nods x2 lengths of hallway each Marching on mini-trampoline x2 min   DATE: 12/18/22: NuStep L5 x5' PT present to discuss status and review goals Supine bridge 2 x 10 (suggested pressure through ball of foot to avoid cramps) Supine bridge with clam 2 x 10 with yellow loop Side lying clam with yellow loop both Supine scissor and bicycle x 20 each SL hip 2x10 - VC for proper set up and range of exercise (both) Prone bilateral hip extension 2x10 Sit to stand hold 10lb yellow loop at thighs 2 x 10 - PT cued neutral pelvis on each rep for standing to avoid anterior thrust of pelvis Squat to table 2 x 10 with 10 lb kb Lateral band walks with yellow loop x 3 laps of 15 feet Standing Hip ER with blue band 2 x 10 (tied tight) Side lying hip IR with yellow loop with balance pad between knees Seated LAQ with 2 1/2 lbs x 20 each Seated up and over hurdle 2 x 10 with 2 1/2 lbs     PATIENT EDUCATION:  Education details: [email or web address] Person educated: Patient Education method: Programmer, multimedia, Demonstration, Verbal cues, and Handouts Education comprehension: verbalized understanding and returned demonstration  HOME EXERCISE PROGRAM: Access Code: [email or web address] URL: [email or web address] Date:  12/10/2022 Prepared by: Loistine Simas Beuhring  Exercises - Supine Figure 4 Piriformis Stretch  - 1 x daily - 7 x weekly - 1 sets - 2-3 reps - 20-30 hold - Modified Thomas Stretch  - 1 x daily - 7 x weekly - 1 sets - 2-3 reps - 20-30 hold - Supine ITB Stretch  - 1 x daily - 7 x weekly - 1 sets - 2-3 reps - 20-30 hold - Supine Bridge  - 1 x daily - 7 x weekly - 1 sets - 10 reps - Straight Leg Raise  - 1 x daily - 7 x weekly - 1 sets - 10 reps - Clamshell with Resistance  - 1 x daily - 7 x weekly - 3 sets - 10 reps - Sidelying Hip Abduction  - 1 x daily - 7 x weekly - 3 sets - 10 reps - Standing Hip Extension with Counter Support  - 1 x daily - 7 x weekly - 1  sets - 10 reps - Beginner Front Arm Support  - 1 x daily - 7 x weekly - 3 sets - 10 reps  ASSESSMENT:  CLINICAL IMPRESSION: Reports overall her back and leg pain has been well-managed with good compliance with her HEP routine.  This morning she has been unable to stretch out tight lumbar/hip musculature and is receptive to doing DN to lumbar multifidi and right gluteals.  The patient benefits significantly from dry needling and demonstrates good trunk mobility at the end of session.  The patient was encouraged in continued performance of HEP to enhance long term benefit.     OBJECTIVE IMPAIRMENTS: decreased activity tolerance, decreased balance, decreased strength, hypomobility, increased fascial restrictions, increased muscle spasms, impaired flexibility, impaired tone, improper body mechanics, postural dysfunction, and pain.   ACTIVITY LIMITATIONS: sitting, squatting, sleeping, stairs, and transfers  PARTICIPATION LIMITATIONS: cleaning, laundry, shopping, community activity, and yard work  PERSONAL FACTORS: Time since onset of injury/illness/exacerbation are also affecting patient's functional outcome.   REHAB POTENTIAL: Excellent  CLINICAL DECISION MAKING: Stable/uncomplicated  EVALUATION COMPLEXITY: Low   GOALS: Goals reviewed  with patient? Yes  SHORT TERM GOALS: Target date: 11/21/22  Pt will be ind with initial HEP without exacerbation of symptoms. Baseline: Goal status: MET 6/13  2.  Pt will improve quad and ITB length to WNL and have negative Claiborne Rigg and Maisie Fus test Baseline:  Goal status: MET 6/13  VESTIBULAR STG: target date 12/26/22  Will be compliant with appropriate progressive HEP to include appropriate modified exercises for BPPV   Baseline: Goal status: MET on 12/24/2022   2.  Dizziness to be no more than 1/10 with positional changes  Baseline:  Goal status: MET on 12/24/2022   3.  Will be able to verbalize signs/symptoms of concern and how to manage orthostatic hypotension on an independent basis  Baseline:  Goal status: MET on 12/24/2022   LONG TERM GOALS: Target date: 01/21/23  Pt will be ind with advanced HEP and understand how to safely progress recreational activities. Baseline:  Goal status: ongoing  2.  Pt will improve Rt hip and core strength to at least 4+/5 to improve functional strength for daily tasks and recreational activities.  Baseline:  Goal status: ongoing  3.  Pt will be able to bend, lift and squat to allow her to garden. Baseline:  Goal status: MET 7/1  4.  Pt will be able to sleep with min pain disruption Baseline:  Goal status: SOME NIGHTS ARE BETTER 7/1  5.  Pt will be able to play golf with min or no pain. Baseline:  Goal status: HASN'T TRIED YET    PLAN:  PT FREQUENCY: 1-2x/week   PT DURATION: 6 weeks  PLANNED INTERVENTIONS: Therapeutic exercises, Therapeutic activity, Neuromuscular re-education, Balance training, Gait training, Patient/Family education, Self Care, Joint mobilization, Dry Needling, Electrical stimulation, Spinal mobilization, Cryotherapy, Moist heat, Taping, Ionotophoresis 4mg /ml Dexamethasone, and Manual therapy.  PLAN FOR NEXT SESSION: KX next session; hold quadruped secondary to acute wrist injury review HEP and progress as  needed,  DN as needed to Rt hip and lumbar spine with more focus on therex.   Progress core and Rt hip strength, closed chain control around Rt knee (laxity present from past injury), functional strength and mobility for gardening and golf Pt no longer needs vestibular appointments.   Lavinia Sharps, PT 01/02/23 4:45 PM Phone: [phone] Fax: [phone]  [facility] [address], [address] [place], [place] [address] Phone # [phone] Fax [phone]

## 2023-01-03 ENCOUNTER — Encounter: Payer: Medicare Other | Admitting: Rehabilitative and Restorative Service Providers"

## 2023-01-08 ENCOUNTER — Encounter: Payer: Medicare Other | Admitting: Rehabilitative and Restorative Service Providers"

## 2023-01-09 ENCOUNTER — Ambulatory Visit: Payer: Medicare Other | Admitting: Physical Therapy

## 2023-01-09 DIAGNOSIS — M25551 Pain in right hip: Secondary | ICD-10-CM | POA: Diagnosis not present

## 2023-01-09 DIAGNOSIS — M6281 Muscle weakness (generalized): Secondary | ICD-10-CM

## 2023-01-09 DIAGNOSIS — R252 Cramp and spasm: Secondary | ICD-10-CM

## 2023-01-09 NOTE — Therapy (Signed)
OUTPATIENT PHYSICAL THERAPY TREATMENT NOTE     Patient Name: Abigail Pruitt MRN: [id number] DOB:1945-12-18, 77 y.o., female Today's Date: 01/09/2023  END OF SESSION:  PT End of Session - 01/09/23 0845     Visit Number 15    Date for PT Re-Evaluation 01/21/23    Authorization Type Medicare needs KX at visit 15    Progress Note Due on Visit 20    PT Start Time 0845    PT Stop Time 0925    PT Time Calculation (min) 40 min    Activity Tolerance Patient tolerated treatment well                   Past Medical History:  Diagnosis Date   Colon polyps    Family hx of colon cancer    Hot flashes    Severe hot flashes   Hyperlipidemia    Osteoarthritis    No past surgical history on file. Patient Active Problem List   Diagnosis Date Noted   Other low back pain 10/23/2021   Pain in right hip 10/23/2021      REFERRING PROVIDER: Christena Deem, MD  REFERRING DIAG: [phone] (ICD-10-CM) - Pain in right hip  Rationale for Evaluation and Treatment: Rehabilitation  THERAPY DIAG:  Pain in right hip  Muscle weakness (generalized)  Cramp and spasm  ONSET DATE: winter 2023  SUBJECTIVE:                                                                                                                                                                                           SUBJECTIVE STATEMENT: I've had a bad week, not sleeping.  I did clean all the [employer] and cleaned the windows. I climbed the ladder to clean the transom.  But I finally slept last night and I feel good today.    Eval: "My skeleton is wearing out."  I have spinal stenosis, I have had L3 and L5 injections recently and it hasn't helped.  In the past, lumbar injections helped my hip but this time it didn't. MRI of Rt hip show some tears.  MD wants me to get stronger to see if it will help. I get pain into my Rt knee and sometimes into my foot.   I really hope to get more DN again b/c it  helped me so much last time.  PERTINENT HISTORY:  Had PT at this clinic in 2023 History of spinal stenosis Plays golf Husband has stage 4 cancer - diagnosed last year after finished PT  PAIN:  PAIN:   Are you having pain? Yes NPRS  scale: 2/10  Pain location: lumbar, Rt hip to Rt knee and sometimes to ankle Pain orientation: Right  PAIN TYPE: aching, dull, tight, and burning, travels to knee and lateral calf Pain description: constant  Aggravating factors: at night, can't sleep on Rt side, being too still (sit too long) Relieving factors: staying active/moving, migun bed (38 min massage bed with heated balls), Voltaren, Aleve/Tylenol   PRECAUTIONS: None  WEIGHT BEARING RESTRICTIONS: No  FALLS:  Has patient fallen in last 6 months? No  LIVING ENVIRONMENT: Lives with: lives with their spouse Lives in: House/apartment Stairs: No Has following equipment at home: None  OCCUPATION: retired  PLOF: Independent  PATIENT GOALS: get stronger, get relief, play golf again, sleep better  NEXT MD VISIT: next month   OBJECTIVE:   DIAGNOSTIC FINDINGS:  No imaging available in chart but Pt reports history of spinal stenosis Recent MRI of Rt hip and lumbar - showed tears of Rt hip - Pt will bring reports next time  PATIENT SURVEYS:  12/04/22: FOTO 83% goal met FOTO 58, goal 61  SCREENING FOR RED FLAGS: Bowel or bladder incontinence: No Spinal tumors: No Cauda equina syndrome: No Compression fracture: No Abdominal aneurysm: No  COGNITION: Overall cognitive status: Within functional limits for tasks assessed     SENSATION: Rt ankle tingly if travels down that far  MUSCLE LENGTH: 6/25: quad length WNL bil Limited quad length bil by 30%  POSTURE: decreased lumbar lordosis and decreased thoracic kyphosis  PALPATION: Rt tight and tender: TFL and ITB, vastus lateralis, tight hamstring, glut min and piriformis Atrophy present through Rt lateral hip Tight lumbar multifidi  bil   LUMBAR ROM:  Full trunk ROM without pain  LOWER EXTREMITY ROM:    Full ROM bil throughout   LOWER EXTREMITY MMT:    MMT Right eval Left eval RIGHT 6/25 LEFT 6/25  Hip flexion 4+ 5 5   Hip extension      Hip abduction 3+ 4+ 4   Hip adduction 5 5    Hip internal rotation 5 5    Hip external rotation 4- 5 4+   Knee flexion 5 5    Knee extension 5 5    Ankle dorsiflexion 5 5    Ankle plantarflexion      Ankle inversion      Ankle eversion       (Blank rows = not tested)  KNEE SPECIAL TESTS:  6/25: negative Ober's on Rt, improving strength and stability in Rt SLS clock drill  Laxity present Rt knee with increased neutral zone + Ober's Test on Rt  FUNCTIONAL TESTS:  7/1: able to balance with less wobble x 10" on Rt LE  SLS much more wobbly on Rt side but able to balance   GAIT: Distance walked: within clinic Assistive device utilized: None Level of assistance: Complete Independence Comments: lacks arm swing and trunk rotation, Pt states will hobble if have been sitting too long  TODAY'S TREATMENT:  01/02/23: NuStep L5 x5' PT present to discuss status  Supine abdominal draw in with bent knee lift /lower 10x Side-lying clamshell with green tband 2x10 bilat Sit to stand 20x Staggered stance 5x reach downs to opposite side  Seated red power cord trunk extension 30x Lateral band walks with yellow loop x 3 laps of 15 feet  unable to do quadruped position secondary to wrist pain  DATE: 12/24/2022 Dix-Hallpike negative bilateral sides Nustep level 5 x5 min with PT present to discuss status Supine bridge 2x10 Supine bridge with clam 2 x 10 with yellow loop (with cuing to decrease velocity) Side-lying clamshell with green tband 2x10 bilat Seated with 3#:  LAQ, hip ER.  2x10 each bilat Standing squat to mat (with brief tap) 2x10 Standing at  barre (to have as support, if needed) performing unilateral foot tap to each side of hurdle 2x10 each bilat Standing forward T at barre x10 bilat Walking down PT gym hallway performing head/eye rotation and head nods x2 lengths of hallway each Marching on mini-trampoline x2 min   DATE: 12/18/22: NuStep L5 x5' PT present to discuss status and review goals Supine bridge 2 x 10 (suggested pressure through ball of foot to avoid cramps) Supine bridge with clam 2 x 10 with yellow loop Side lying clam with yellow loop both Supine scissor and bicycle x 20 each SL hip 2x10 - VC for proper set up and range of exercise (both) Prone bilateral hip extension 2x10 Sit to stand hold 10lb yellow loop at thighs 2 x 10 - PT cued neutral pelvis on each rep for standing to avoid anterior thrust of pelvis Squat to table 2 x 10 with 10 lb kb Lateral band walks with yellow loop x 3 laps of 15 feet Standing Hip ER with blue band 2 x 10 (tied tight) Side lying hip IR with yellow loop with balance pad between knees Seated LAQ with 2 1/2 lbs x 20 each Seated up and over hurdle 2 x 10 with 2 1/2 lbs     PATIENT EDUCATION:  Education details: [email or web address] Person educated: Patient Education method: Programmer, multimedia, Demonstration, Verbal cues, and Handouts Education comprehension: verbalized understanding and returned demonstration  HOME EXERCISE PROGRAM: Access Code: [email or web address] URL: [email or web address] Date: 12/10/2022 Prepared by: Loistine Simas Beuhring  Exercises - Supine Figure 4 Piriformis Stretch  - 1 x daily - 7 x weekly - 1 sets - 2-3 reps - 20-30 hold - Modified Thomas Stretch  - 1 x daily - 7 x weekly - 1 sets - 2-3 reps - 20-30 hold - Supine ITB Stretch  - 1 x daily - 7 x weekly - 1 sets - 2-3 reps - 20-30 hold - Supine Bridge  - 1 x daily - 7 x weekly - 1 sets - 10 reps - Straight Leg Raise  - 1 x daily - 7 x weekly - 1 sets - 10 reps - Clamshell with Resistance  - 1 x daily - 7 x weekly - 3 sets  - 10 reps - Sidelying Hip Abduction  - 1 x daily - 7 x weekly - 3 sets - 10 reps - Standing Hip Extension with Counter Support  - 1 x daily - 7 x weekly - 1 sets - 10 reps - Beginner Front Arm Support  - 1 x daily - 7 x weekly - 3 sets - 10 reps  ASSESSMENT:  CLINICAL IMPRESSION: The patient has had a busy week including a lot of physical activity.  She had increased  pain but feels good today and she is able to perform a moderate level of core and LE strengthening ex's.  Verbal cues for technique to optimize exercise benefit.  She declines the need for DN today.     OBJECTIVE IMPAIRMENTS: decreased activity tolerance, decreased balance, decreased strength, hypomobility, increased fascial restrictions, increased muscle spasms, impaired flexibility, impaired tone, improper body mechanics, postural dysfunction, and pain.   ACTIVITY LIMITATIONS: sitting, squatting, sleeping, stairs, and transfers  PARTICIPATION LIMITATIONS: cleaning, laundry, shopping, community activity, and yard work  PERSONAL FACTORS: Time since onset of injury/illness/exacerbation are also affecting patient's functional outcome.   REHAB POTENTIAL: Excellent  CLINICAL DECISION MAKING: Stable/uncomplicated  EVALUATION COMPLEXITY: Low   GOALS: Goals reviewed with patient? Yes  SHORT TERM GOALS: Target date: 11/21/22  Pt will be ind with initial HEP without exacerbation of symptoms. Baseline: Goal status: MET 6/13  2.  Pt will improve quad and ITB length to WNL and have negative Claiborne Rigg and Maisie Fus test Baseline:  Goal status: MET 6/13  VESTIBULAR STG: target date 12/26/22  Will be compliant with appropriate progressive HEP to include appropriate modified exercises for BPPV   Baseline: Goal status: MET on 12/24/2022   2.  Dizziness to be no more than 1/10 with positional changes  Baseline:  Goal status: MET on 12/24/2022   3.  Will be able to verbalize signs/symptoms of concern and how to manage orthostatic  hypotension on an independent basis  Baseline:  Goal status: MET on 12/24/2022   LONG TERM GOALS: Target date: 01/21/23  Pt will be ind with advanced HEP and understand how to safely progress recreational activities. Baseline:  Goal status: ongoing  2.  Pt will improve Rt hip and core strength to at least 4+/5 to improve functional strength for daily tasks and recreational activities.  Baseline:  Goal status: ongoing  3.  Pt will be able to bend, lift and squat to allow her to garden. Baseline:  Goal status: MET 7/1  4.  Pt will be able to sleep with min pain disruption Baseline:  Goal status: SOME NIGHTS ARE BETTER 7/1  5.  Pt will be able to play golf with min or no pain. Baseline:  Goal status: HASN'T TRIED YET    PLAN:  PT FREQUENCY: 1-2x/week   PT DURATION: 6 weeks  PLANNED INTERVENTIONS: Therapeutic exercises, Therapeutic activity, Neuromuscular re-education, Balance training, Gait training, Patient/Family education, Self Care, Joint mobilization, Dry Needling, Electrical stimulation, Spinal mobilization, Cryotherapy, Moist heat, Taping, Ionotophoresis 4mg /ml Dexamethasone, and Manual therapy.  PLAN FOR NEXT SESSION: KX; may be ready for discharge in 2-3 more visits; hold quadruped secondary to acute wrist injury review HEP and progress as needed,  DN as needed to Rt hip and lumbar spine with more focus on therex.   Progress core and Rt hip strength, closed chain control around Rt knee (laxity present from past injury), functional strength and mobility for gardening and golf Pt no longer needs vestibular appointments.  Lavinia Sharps, PT 01/09/23 6:11 PM Phone: [phone] Fax: [phone]   [facility] [address] [place], [place] [address] Phone # [phone] Fax [phone]

## 2023-01-14 ENCOUNTER — Ambulatory Visit: Payer: Medicare Other | Admitting: Physical Therapy

## 2023-01-17 ENCOUNTER — Encounter: Payer: Medicare Other | Admitting: Physical Therapy

## 2023-01-21 ENCOUNTER — Encounter: Payer: Self-pay | Admitting: Rehabilitative and Restorative Service Providers"

## 2023-01-21 ENCOUNTER — Ambulatory Visit: Payer: Medicare Other | Attending: Sports Medicine | Admitting: Rehabilitative and Restorative Service Providers"

## 2023-01-21 DIAGNOSIS — R252 Cramp and spasm: Secondary | ICD-10-CM | POA: Diagnosis present

## 2023-01-21 DIAGNOSIS — M6281 Muscle weakness (generalized): Secondary | ICD-10-CM

## 2023-01-21 DIAGNOSIS — R262 Difficulty in walking, not elsewhere classified: Secondary | ICD-10-CM | POA: Diagnosis present

## 2023-01-21 DIAGNOSIS — G8929 Other chronic pain: Secondary | ICD-10-CM

## 2023-01-21 DIAGNOSIS — M25551 Pain in right hip: Secondary | ICD-10-CM | POA: Diagnosis present

## 2023-01-21 DIAGNOSIS — M25561 Pain in right knee: Secondary | ICD-10-CM | POA: Insufficient documentation

## 2023-01-21 NOTE — Therapy (Signed)
OUTPATIENT PHYSICAL THERAPY TREATMENT NOTE AND DISCHARGE SUMMARY     Patient Name: Abigail Pruitt MRN: [id number] DOB:1945-10-18, 77 y.o., female Today's Date: 01/21/2023  END OF SESSION:  PT End of Session - 01/21/23 0939     Visit Number 16    Date for PT Re-Evaluation 01/21/23    Authorization Type Medicare needs KX at visit 15    Progress Note Due on Visit 20    PT Start Time 0930    PT Stop Time 1010    PT Time Calculation (min) 40 min    Activity Tolerance Patient tolerated treatment well    Behavior During Therapy WFL for tasks assessed/performed               Past Medical History:  Diagnosis Date   Colon polyps    Family hx of colon cancer    Hot flashes    Severe hot flashes   Hyperlipidemia    Osteoarthritis    History reviewed. No pertinent surgical history. Patient Active Problem List   Diagnosis Date Noted   Other low back pain 10/23/2021   Pain in right hip 10/23/2021      REFERRING PROVIDER: Christena Deem, MD  REFERRING DIAG: [phone] (ICD-10-CM) - Pain in right hip  Rationale for Evaluation and Treatment: Rehabilitation  THERAPY DIAG:  Pain in right hip  Muscle weakness (generalized)  Cramp and spasm  Chronic pain of right knee  Difficulty in walking, not elsewhere classified  ONSET DATE: winter 2023  SUBJECTIVE:                                                                                                                                                                                           SUBJECTIVE STATEMENT: Pt reports that her hip is feeling fine and denies current pain.  Pt reports that the dizziness is what she is having the biggest problem with today.  Pt reports that she also wants to review her HEP.  PERTINENT HISTORY:  Had PT at this clinic in 2023 History of spinal stenosis Plays golf Husband has stage 4 cancer - diagnosed last year after finished PT  PAIN:  PAIN:   Are you having pain? No NPRS  scale: 0/10  Pain location: lumbar, Rt hip to Rt knee and sometimes to ankle Pain orientation: Right  PAIN TYPE: aching, dull, tight, and burning, travels to knee and lateral calf Pain description: constant  Aggravating factors: at night, can't sleep on Rt side, being too still (sit too long) Relieving factors: staying active/moving, migun bed (38 min massage bed with heated balls), Voltaren, Aleve/Tylenol  PRECAUTIONS: None  WEIGHT BEARING RESTRICTIONS: No  FALLS:  Has patient fallen in last 6 months? No  LIVING ENVIRONMENT: Lives with: lives with their spouse Lives in: House/apartment Stairs: No Has following equipment at home: None  OCCUPATION: retired  PLOF: Independent  PATIENT GOALS: get stronger, get relief, play golf again, sleep better  NEXT MD VISIT: next month   OBJECTIVE:   DIAGNOSTIC FINDINGS:  No imaging available in chart but Pt reports history of spinal stenosis Recent MRI of Rt hip and lumbar - showed tears of Rt hip - Pt will bring reports next time  PATIENT SURVEYS:  12/04/22: FOTO 83% goal met FOTO 58, goal 61  SCREENING FOR RED FLAGS: Bowel or bladder incontinence: No Spinal tumors: No Cauda equina syndrome: No Compression fracture: No Abdominal aneurysm: No  COGNITION: Overall cognitive status: Within functional limits for tasks assessed     SENSATION: Rt ankle tingly if travels down that far  MUSCLE LENGTH: 6/25: quad length WNL bil Limited quad length bil by 30%  POSTURE: decreased lumbar lordosis and decreased thoracic kyphosis  PALPATION: Rt tight and tender: TFL and ITB, vastus lateralis, tight hamstring, glut min and piriformis Atrophy present through Rt lateral hip Tight lumbar multifidi bil   LUMBAR ROM:  Full trunk ROM without pain  LOWER EXTREMITY ROM:    Full ROM bil throughout   LOWER EXTREMITY MMT:    MMT Right eval Left eval RIGHT 6/25 LEFT 6/25  Hip flexion 4+ 5 5   Hip extension      Hip abduction  3+ 4+ 4   Hip adduction 5 5    Hip internal rotation 5 5    Hip external rotation 4- 5 4+   Knee flexion 5 5    Knee extension 5 5    Ankle dorsiflexion 5 5    Ankle plantarflexion      Ankle inversion      Ankle eversion       (Blank rows = not tested)  KNEE SPECIAL TESTS:  6/25: negative Ober's on Rt, improving strength and stability in Rt SLS clock drill  Laxity present Rt knee with increased neutral zone + Ober's Test on Rt  FUNCTIONAL TESTS:  7/1: able to balance with less wobble x 10" on Rt LE  SLS much more wobbly on Rt side but able to balance   GAIT: Distance walked: within clinic Assistive device utilized: None Level of assistance: Complete Independence Comments: lacks arm swing and trunk rotation, Pt states will hobble if have been sitting too long  TODAY'S TREATMENT:                                                                                                                              01/21/2023: NuStep L5 x5' PT present to discuss status  Pt with positive Dix-Hallpike to left side.  Provided pt with handout for self-Epley to left ear.  Proceeded with teaching pt this exercise  x3 to ensure pt had knowledge to perform independently at home post discharge Standing hamstring stretch at stairs 2x20 sec bilat Standing forward leaning hip extension x10 bilat (as patient unable to perform in quadruped due to shoulder pain) Reviewed all of HEP and provided pt with updated handout to include HEP from previous episode and current one to allow for all exercises to be together on one handout.  Provided pt with one copy to take with her to [facility].    01/02/23: NuStep L5 x5' PT present to discuss status  Supine abdominal draw in with bent knee lift /lower 10x Side-lying clamshell with green tband 2x10 bilat Sit to stand 20x Staggered stance 5x reach downs to opposite side  Seated red power cord trunk extension 30x Lateral band walks with yellow loop x 3 laps of 15 feet   unable to do quadruped position secondary to wrist pain  DATE: 12/24/2022 Dix-Hallpike negative bilateral sides Nustep level 5 x5 min with PT present to discuss status Supine bridge 2x10 Supine bridge with clam 2 x 10 with yellow loop (with cuing to decrease velocity) Side-lying clamshell with green tband 2x10 bilat Seated with 3#:  LAQ, hip ER.  2x10 each bilat Standing squat to mat (with brief tap) 2x10 Standing at barre (to have as support, if needed) performing unilateral foot tap to each side of hurdle 2x10 each bilat Standing forward T at barre x10 bilat Walking down PT gym hallway performing head/eye rotation and head nods x2 lengths of hallway each Marching on mini-trampoline x2 min   PATIENT EDUCATION:  Education details: [email or web address] Person educated: Patient Education method: Programmer, multimedia, Facilities manager, Verbal cues, and Handouts Education comprehension: verbalized understanding and returned demonstration  HOME EXERCISE PROGRAM: Access Code: [email or web address] URL: [email or web address] Date: 01/21/2023 Prepared by: Clydie Braun Krislynn Gronau  Exercises - Supine Figure 4 Piriformis Stretch  - 1 x daily - 7 x weekly - 1 sets - 2-3 reps - 20-30 hold - Modified Thomas Stretch  - 1 x daily - 7 x weekly - 1 sets - 2-3 reps - 20-30 hold - Supine ITB Stretch  - 1 x daily - 7 x weekly - 1 sets - 2-3 reps - 20-30 hold - Supine Hamstring Stretch with Strap  - 1 x daily - 7 x weekly - 2 reps - 20 sec hold - Supine ITB Stretch with Strap  - 1 x daily - 7 x weekly - 2 reps - 20 sec hold - Supine Posterior Pelvic Tilt  - 1 x daily - 7 x weekly - 2 sets - 10 reps - Supine Bridge  - 1 x daily - 7 x weekly - 1 sets - 10 reps - Straight Leg Raise  - 1 x daily - 7 x weekly - 1 sets - 10 reps - Clamshell with Resistance  - 1 x daily - 7 x weekly - 3 sets - 10 reps - Sidelying Hip Abduction  - 1 x daily - 7 x weekly - 3 sets - 10 reps - Bird Dog on Counter  - 1 x daily - 7 x weekly - 2 sets - 10 reps -  Standing 4-Way Leg Reach with Counter Support  - 1 x daily - 7 x weekly - 2 sets - 10 reps - Standing Hamstring Stretch on Chair  - 1 x daily - 7 x weekly - 3 reps - 20 sec hold - Quadricep Stretch with Chair and Counter Support  - 1 x daily - 7 x weekly - 2  reps - 20 sec hold - Supine 90/90 Alternating Heel Touches with Posterior Pelvic Tilt  - 1 x daily - 7 x weekly - 2 sets - 10 reps - Supine Bicycles  - 1 x daily - 7 x weekly - 2 sets - 10 reps - Hooklying Isometric Hip Flexion  - 1 x daily - 7 x weekly - 1 sets - 10 reps - Self-Epley Maneuver Left Ear  - 1 x daily - 7 x weekly - 2-3 reps  ASSESSMENT:  CLINICAL IMPRESSION: Ms Hetzel presents to skilled PT stating that she is ready for discharge from her hip, but that she is having some dizziness today and would like to look at that.  Patient with positive Gilberto Better on the left side.  Provided pt with HEP of self-epley to allow her to address the dizziness (as her original referral did include vestibular and canalith repositioning).  Patient with good return demonstration and did have decreased nystagmus with each subsequent trial of self-epley.  Patient to continue HEP daily and perform self-epley, as needed for dizziness.  Patient verbalizes understanding to reach out to her MD if she feels that she may benefit from continued PT in the future, but feels like she is able to perform tasks at this time independently.    OBJECTIVE IMPAIRMENTS: decreased activity tolerance, decreased balance, decreased strength, hypomobility, increased fascial restrictions, increased muscle spasms, impaired flexibility, impaired tone, improper body mechanics, postural dysfunction, and pain.   ACTIVITY LIMITATIONS: sitting, squatting, sleeping, stairs, and transfers  PARTICIPATION LIMITATIONS: cleaning, laundry, shopping, community activity, and yard work  PERSONAL FACTORS: Time since onset of injury/illness/exacerbation are also affecting patient's functional  outcome.   REHAB POTENTIAL: Excellent  CLINICAL DECISION MAKING: Stable/uncomplicated  EVALUATION COMPLEXITY: Low   GOALS: Goals reviewed with patient? Yes  SHORT TERM GOALS: Target date: 11/21/22  Pt will be ind with initial HEP without exacerbation of symptoms. Baseline: Goal status: MET 6/13  2.  Pt will improve quad and ITB length to WNL and have negative Claiborne Rigg and Maisie Fus test Baseline:  Goal status: MET 6/13  VESTIBULAR STG: target date 12/26/22  Will be compliant with appropriate progressive HEP to include appropriate modified exercises for BPPV   Baseline: Goal status: MET on 12/24/2022   2.  Dizziness to be no more than 1/10 with positional changes  Baseline:  Goal status: MET on 12/24/2022   3.  Will be able to verbalize signs/symptoms of concern and how to manage orthostatic hypotension on an independent basis  Baseline:  Goal status: MET on 12/24/2022   LONG TERM GOALS: Target date: 01/21/23  Pt will be ind with advanced HEP and understand how to safely progress recreational activities. Baseline:  Goal status: MET  2.  Pt will improve Rt hip and core strength to at least 4+/5 to improve functional strength for daily tasks and recreational activities.  Baseline:  Goal status: MET  3.  Pt will be able to bend, lift and squat to allow her to garden. Baseline:  Goal status: MET 7/1  4.  Pt will be able to sleep with min pain disruption Baseline:  Goal status: MET  5.  Pt will be able to play golf with min or no pain. Baseline:  Goal status: HASN'T TRIED YET    PLAN:  PT FREQUENCY: 1-2x/week   PT DURATION: 6 weeks  PLANNED INTERVENTIONS: Therapeutic exercises, Therapeutic activity, Neuromuscular re-education, Balance training, Gait training, Patient/Family education, Self Care, Joint mobilization, Dry Needling, Electrical stimulation, Spinal  mobilization, Cryotherapy, Moist heat, Taping, Ionotophoresis 4mg /ml Dexamethasone, and Manual  therapy.    PHYSICAL THERAPY DISCHARGE SUMMARY   Patient agrees to discharge. Patient goals were met. Patient is being discharged due to meeting the stated rehab goals.  Pt with some dizziness reported today with positive Gilberto Better on left, however, patient was able to perform self epley to treat left side.  Patient with good return demonstration and provided handout to self-treat herself for BPPV at home.     Reather Laurence, PT, DPT 01/21/23, 12:02 PM  [facility] [address], [address] [place], [place] [address] Phone # [phone] Fax [phone]

## 2023-10-09 ENCOUNTER — Ambulatory Visit: Admitting: Physical Therapy

## 2023-10-09 ENCOUNTER — Encounter: Payer: Self-pay | Admitting: Physical Therapy

## 2023-10-09 ENCOUNTER — Other Ambulatory Visit: Payer: Self-pay

## 2023-10-09 DIAGNOSIS — M6281 Muscle weakness (generalized): Secondary | ICD-10-CM | POA: Diagnosis present

## 2023-10-09 DIAGNOSIS — M25551 Pain in right hip: Secondary | ICD-10-CM | POA: Diagnosis present

## 2023-10-09 NOTE — Therapy (Signed)
 OUTPATIENT PHYSICAL THERAPY LOWER EXTREMITY EVALUATION   Patient Name: Abigail Pruitt MRN: [id number] DOB:August 20, 1945, 78 y.o., female Today's Date: 10/09/2023  END OF SESSION:  PT End of Session - 10/09/23 0842     Visit Number 1    Date for PT Re-Evaluation 12/04/23    Authorization Type Medicare/[employer]    Progress Note Due on Visit 10    PT Start Time [phone]    PT Stop Time 0930    PT Time Calculation (min) 47 min    Activity Tolerance Patient tolerated treatment well             Past Medical History:  Diagnosis Date   Colon polyps    Family hx of colon cancer    Hot flashes    Severe hot flashes   Hyperlipidemia    Osteoarthritis    History reviewed. No pertinent surgical history. 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 Yes NPRS scale: 0/10 now 10/10 at night Pain location: right lateral Pain orientation: Right  PAIN TYPE: throbbing Pain description: intermittent  Relieving factors: fine during the day Aggravating factors:  I fight the bed all night long worse with either side; supine lying not good for breathing; my low back is tight;  lying on affected side, trouble walking b/c of  right knee not this problem     PRECAUTIONS: None    WEIGHT BEARING RESTRICTIONS: No  FALLS:  Has patient fallen in last 6 months? No  LIVING ENVIRONMENT: Lives with: lives with their spouse Lives in: House/apartment    OCCUPATION: retired  PLOF: Independent  PATIENT GOALS: I want this IT band to behave; carry on with active lifestyle  NEXT MD VISIT: in [place]   FUNCTIONAL OUTCOME MEASURE:  LEFS    69  /80  POSTURE:  static standing posture symmetrical  PALPATION: tenderness to palpation of proximal HS, trochanteric bursa  TRUNK ROM:  WFLs in all planes; +right LE neural tension   TRUNK STRENGTH:  Decreased activation of transverse abdominus muscles; abdominals 4-/5; decreased activation of lumbar multifidi; trunk extensors 4-/5  HIP ROM:  patient has full hip ROM with tendency toward hypermobility   STRENGTH:  right hip flexion 4+/5, hip abduction 4-/5; hip extension 4-/5 painful; knee flexion and extension 4+/5  SPECIAL TESTS:   Single leg stance test    5  sec on each side;  + Trendelenburg sign (contralateral hip drop)  FUNCTIONAL TESTS:   5X Sit to stand test  14.06 noted hip internal rotation/genu valgus/medial collapse  2 MWT 387 feet            GAIT: tends to externally rotate right LE  TREATMENT DATE: 10/09/23 Evaluation   Discussed sleeping positioning and avoiding soft tissue compression Discussed car seat set up to avoid neural tension for prolonged periods Avoidance of crossing legs when sitting to avoid tissue compression Initial HEP: attention to patellofemoral alignment with sit to stand   PATIENT EDUCATION:  Education details: Educated patient on anatomy and physiology of current symptoms, prognosis, plan of care as well as initial self care strategies to promote recovery Person educated: Patient Education method:  Explanation Education comprehension: verbalized understanding  HOME EXERCISE PROGRAM: Access Code: [facility] URL: [email or web address] Date: 10/09/2023 Prepared by: Darien Eden  Exercises - Prone Gluteal Sets  - 1 x daily - 7 x weekly - 1 sets - 10 reps - 5 hold - Supine Bent Knee Foot Taps  - 1 x daily - 7 x weekly - 1 sets - 10 reps - Single Leg Stance  - 1 x daily - 7 x weekly - 1 sets - 5 reps - 5-10 hold - Sit to Stand  - 1 x daily - 7 x weekly - 1 sets - 5 reps  ASSESSMENT:  CLINICAL IMPRESSION: Patient is a 78 y.o. female who was seen today for physical therapy evaluation and treatment for right trochanteric hip burisitis and iliotibial tract pain. Decreased hip extension and hip abduction strength as well as decreased trunk/core strength are contributing factors.  +right neural tension signs.  She reports difficulty sleeping, squatting and rolling over in bed.  She would benefit from PT to address these impairments affecting function.   OBJECTIVE IMPAIRMENTS: decreased activity tolerance, decreased mobility, difficulty walking, decreased strength, increased fascial restrictions, impaired perceived functional ability, and pain.   ACTIVITY LIMITATIONS: squatting, sleeping, bed mobility, and locomotion level  PARTICIPATION LIMITATIONS: meal prep, cleaning, laundry, and driving  PERSONAL FACTORS: Time since onset of injury/illness/exacerbation and 1-2 comorbidities: spinal surgeries, knee pain  are also affecting patient's functional outcome.   REHAB POTENTIAL: Good  CLINICAL DECISION MAKING: Stable/uncomplicated  EVALUATION COMPLEXITY: Low   GOALS: Goals reviewed with patient? Yes  SHORT TERM GOALS: Target date: 11/06/2023  The patient will demonstrate knowledge of basic self care strategies and exercises to promote healing  Baseline: Goal status: INITIAL  2.  The patient will have improved hip strength to at least 4/5 needed for standing, walking  longer distances and descending stairs at home and in the community  Baseline:  Goal status: INITIAL  3.  Improved quality of sleep by 30% Baseline:  Goal status: INITIAL  4.  30% improvement in pain with turning over in bed Baseline:  Goal status: INITIAL     LONG TERM GOALS: Target date: 12/04/2023   The patient will be independent in a safe self progression of a home exercise program to promote further recovery of function  Baseline:  Goal status: INITIAL  2.  The patient will have improved hip strength to at least 4+/5 needed for standing, walking longer distances and descending stairs at home and in the community Baseline:  Goal status: INITIAL  3.  Improved quality of sleep by 60% Baseline:  Goal status: INITIAL  4.  Patient will be able to return to a regular walking program  Baseline:  Goal status: INITIAL  5.  LEFS improve to   73  /80 indicating improved function with less pain Baseline:  Goal status: INITIAL    PLAN:  PT FREQUENCY: 2x/week  PT DURATION: 8 weeks  PLANNED INTERVENTIONS: 97164- PT Re-evaluation, 97110-Therapeutic exercises, 97530- Therapeutic activity, [id number]- Neuromuscular re-education, 97535-  Self Care, 96045- Manual therapy, [phone]- Aquatic Therapy, [phone]- Electrical stimulation (unattended), [phone]- Electrical stimulation (manual), [id number]- Ultrasound, [id number]- Ionotophoresis 4mg /ml Dexamethasone, Patient/Family education, Balance training, Stair training, Taping, Dry Needling, Joint mobilization, Spinal manipulation, Cryotherapy, and Moist heat  PLAN FOR NEXT SESSION: review initial HEP; DN with ES to glueals, ITB, gluteal strengthening (isometrics)  Darien Eden, PT 10/09/23 7:21 PM Phone: [phone] Fax: [phone]

## 2023-10-11 ENCOUNTER — Ambulatory Visit: Admitting: Physical Therapy

## 2023-10-11 DIAGNOSIS — R252 Cramp and spasm: Secondary | ICD-10-CM | POA: Diagnosis present

## 2023-10-11 DIAGNOSIS — M6281 Muscle weakness (generalized): Secondary | ICD-10-CM | POA: Diagnosis present

## 2023-10-11 DIAGNOSIS — M25551 Pain in right hip: Secondary | ICD-10-CM | POA: Insufficient documentation

## 2023-10-11 NOTE — Therapy (Signed)
 OUTPATIENT PHYSICAL THERAPY LOWER EXTREMITY PROGRESS NOTE   Patient Name: Abigail Pruitt MRN: [id number] DOB:[birth year], 78 y.o., female Today's Date: 10/11/2023  END OF SESSION:  PT End of Session - 10/11/23 0757     Visit Number 2    Date for PT Re-Evaluation 12/04/23    Authorization Type Medicare/AARP    Progress Note Due on Visit 10    PT Start Time 0800    PT Stop Time 0840    PT Time Calculation (min) 40 min    Activity Tolerance Patient tolerated treatment well             Past Medical History:  Diagnosis Date   Colon polyps    Family hx of colon cancer    Hot flashes    Severe hot flashes   Hyperlipidemia    Osteoarthritis    No past surgical history on file. 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  My foot turns out with fatigue with walking. Worked in the yard yesterday.  Slept better last night.    PERTINENT HISTORY: History of spinal stenosis with surgery 04/25/2023 L3-4 fusion, good response Knee gel injections 3x (better) Cervical fusion  PAIN:   Are you having pain? Yes NPRS scale: tight across low back 2/10 Pain location: stiffness in low back and right buttock Pain orientation: Right  PAIN TYPE: throbbing Pain description: intermittent  Relieving factors: fine during the day Aggravating factors:  I fight the bed all night long worse with either side; supine lying not good for breathing; my low back is tight;  lying on affected side, trouble walking b/c of right knee not this problem     PRECAUTIONS:  None    WEIGHT BEARING RESTRICTIONS: No  FALLS:  Has patient fallen in last 6 months? No  LIVING ENVIRONMENT: Lives with: lives with their spouse Lives in: House/apartment    OCCUPATION: retired  PLOF: Independent  PATIENT GOALS: I want this IT band to behave; carry on with active lifestyle  NEXT MD VISIT: in [place]   FUNCTIONAL OUTCOME MEASURE:  LEFS    69  /80  POSTURE:  static standing posture symmetrical  PALPATION: tenderness to palpation of proximal HS, trochanteric bursa  TRUNK ROM:  WFLs in all planes; +right LE neural tension   TRUNK STRENGTH:  Decreased activation of transverse abdominus muscles; abdominals 4-/5; decreased activation of lumbar multifidi; trunk extensors 4-/5  HIP ROM:  patient has full hip ROM with tendency toward hypermobility   STRENGTH:  right hip flexion 4+/5, hip abduction 4-/5; hip extension 4-/5 painful; knee flexion and extension 4+/5  SPECIAL TESTS:   Single leg stance test    5  sec on each side;  + Trendelenburg sign (contralateral hip drop)  FUNCTIONAL TESTS:   5X Sit to stand test  14.06 noted hip internal rotation/genu valgus/medial collapse  2 MWT 387 feet            GAIT: tends to externally rotate right LE  TREATMENT DATE: 10/11/23 Review of old HEP and discussed holding on these for now and will slowly reintegrate over time Sit to stand much improved patellofemoral alignment SLS: 10 sec 5x each left more difficult than right "Toothpick holder exercise" modified single leg RDL 5x right/left  Supine abdominal draw in with marching 10x Prone glute squeeze 10x Manual therapy: soft tissue mobilization to bil lumbar musculature and right gluteals Trigger Point Dry Needling Initial Treatment: Pt instructed on Dry Needling rational, procedures, and possible side effects. Pt instructed to expect mild to moderate muscle soreness later in the day and/or into the next day.  Pt instructed in methods to reduce muscle  soreness. Pt instructed to continue prescribed HEP. Patient Verbal Consent Given: Yes Education Handout Provided: Previously Provided pt has had at this clinic numerous times over the years Muscles Treated: bil lumbar multifidi, right gluteals Electrical Stimulation Performed: No Treatment Response/Outcome: improved soft tissue mobility                                                                                                                TREATMENT DATE: 10/09/23 Evaluation   Discussed sleeping positioning and avoiding soft tissue compression Discussed car seat set up to avoid neural tension for prolonged periods Avoidance of crossing legs when sitting to avoid tissue compression Initial HEP: attention to patellofemoral alignment with sit to stand   PATIENT EDUCATION:  Education details: Educated patient on anatomy and physiology of current symptoms, prognosis, plan of care as well as initial self care strategies to promote recovery Person educated: Patient Education method: Explanation Education comprehension: verbalized understanding  HOME EXERCISE PROGRAM: Access Code: [facility] URL: [email or web address] Date: 10/09/2023 Prepared by: Darien Eden  Exercises - Prone Gluteal Sets  - 1 x daily - 7 x weekly - 1 sets - 10 reps - 5 hold - Supine Bent Knee Foot Taps  - 1 x daily - 7 x weekly - 1 sets - 10 reps - Single Leg Stance  - 1 x daily - 7 x weekly - 1 sets - 5 reps - 5-10 hold - Sit to Stand  - 1 x daily - 7 x weekly - 1 sets - 5 reps  ASSESSMENT:  CLINICAL IMPRESSION: [name] reports better sleep last night and demonstrates good compliance with initial HEP.  Review of HEP with minimal verbal cues needed for technique. Bilateral glute weakness is apparent with single leg stance. The patient had a positive initial response to DN with much improved soft tissue mobility and decreased size and number of tender points.  Therapist monitoring response and educating  patient on what to expect following DN and how to optimize benefit with specific exercise.  Anticipate pain intensity and mobility will continue to improve over the next few days.     OBJECTIVE IMPAIRMENTS: decreased activity tolerance, decreased mobility, difficulty walking, decreased strength, increased fascial restrictions, impaired perceived functional ability, and pain.   ACTIVITY LIMITATIONS: squatting, sleeping, bed mobility, and locomotion level  PARTICIPATION LIMITATIONS: meal prep, cleaning, laundry, and driving  PERSONAL FACTORS: Time since onset of injury/illness/exacerbation and 1-2 comorbidities: spinal surgeries, knee pain  are also affecting patient's functional outcome.   REHAB POTENTIAL: Good  CLINICAL DECISION MAKING: Stable/uncomplicated  EVALUATION COMPLEXITY: Low   GOALS: Goals reviewed with patient? Yes  SHORT TERM GOALS: Target date: 11/06/2023  The patient will demonstrate knowledge of basic self care strategies and exercises to promote healing  Baseline: Goal status: INITIAL  2.  The patient will have improved hip strength to at least 4/5 needed for standing, walking longer distances and descending stairs at home and in the community  Baseline:  Goal status: INITIAL  3.  Improved quality of sleep by 30% Baseline:  Goal status: INITIAL  4.  30% improvement in pain with turning over in bed Baseline:  Goal status: INITIAL     LONG TERM GOALS: Target date: 12/04/2023   The patient will be independent in a safe self progression of a home exercise program to promote further recovery of function  Baseline:  Goal status: INITIAL  2.  The patient will have improved hip strength to at least 4+/5 needed for standing, walking longer distances and descending stairs at home and in the community Baseline:  Goal status: INITIAL  3.  Improved quality of sleep by 60% Baseline:  Goal status: INITIAL  4.  Patient will be able to return to a regular walking  program  Baseline:  Goal status: INITIAL  5.  LEFS improve to   73  /80 indicating improved function with less pain Baseline:  Goal status: INITIAL    PLAN:  PT FREQUENCY: 2x/week  PT DURATION: 8 weeks  PLANNED INTERVENTIONS: 97164- PT Re-evaluation, 97110-Therapeutic exercises, 97530- Therapeutic activity, 97112- Neuromuscular re-education, 97535- Self Care, 16109- Manual therapy, [phone]- Aquatic Therapy, [id number]- Electrical stimulation (unattended), [phone]- Electrical stimulation (manual), [id number]- Ultrasound, 91478- Ionotophoresis 4mg /ml Dexamethasone, Patient/Family education, Balance training, Stair training, Taping, Dry Needling, Joint mobilization, Spinal manipulation, Cryotherapy, and Moist heat  PLAN FOR NEXT SESSION: review and progress HEP; DN with ES to glueals, ITB, gluteal strengthening (isometrics)  Darien Eden, PT 10/11/23 8:51 AM Phone: [phone] Fax: [phone]

## 2023-10-18 ENCOUNTER — Ambulatory Visit: Admitting: Physical Therapy

## 2023-10-18 DIAGNOSIS — M6281 Muscle weakness (generalized): Secondary | ICD-10-CM

## 2023-10-18 DIAGNOSIS — M25551 Pain in right hip: Secondary | ICD-10-CM

## 2023-10-18 DIAGNOSIS — R252 Cramp and spasm: Secondary | ICD-10-CM

## 2023-10-18 NOTE — Therapy (Signed)
 OUTPATIENT PHYSICAL THERAPY LOWER EXTREMITY PROGRESS NOTE   Patient Name: Abigail Pruitt MRN: [id number] DOB:[birth year], 78 y.o., female Today's Date: 10/18/2023  END OF SESSION:  PT End of Session - 10/18/23 0849     Visit Number 3    Date for PT Re-Evaluation 12/04/23    Authorization Type Medicare/AARP    Progress Note Due on Visit 10    PT Start Time 0846    PT Stop Time 0928    PT Time Calculation (min) 42 min    Activity Tolerance Patient tolerated treatment well             Past Medical History:  Diagnosis Date   Colon polyps    Family hx of colon cancer    Hot flashes    Severe hot flashes   Hyperlipidemia    Osteoarthritis    No past surgical history on file. 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 So much trouble doing the leaning over ex's (RDLs).  Going to [place]  next week.  The needling helped a lot, I don't think I need it today.  PERTINENT HISTORY: History of spinal stenosis with surgery 04/25/2023 L3-4 fusion, good response Knee gel injections 3x (better) Cervical fusion  PAIN:   Are you having pain? Yes NPRS scale: tight across low back 1/10 Pain location: stiffness in low back and right buttock Pain orientation: Right  PAIN TYPE: throbbing Pain description: intermittent  Relieving factors: fine during the day Aggravating factors:  I fight the bed all night long worse with either side; supine lying not good for breathing; my low back is tight;  lying on affected side, trouble walking b/c of right knee not this problem     PRECAUTIONS: None    WEIGHT  BEARING RESTRICTIONS: No  FALLS:  Has patient fallen in last 6 months? No  LIVING ENVIRONMENT: Lives with: lives with their spouse Lives in: House/apartment    OCCUPATION: retired  PLOF: Independent  PATIENT GOALS: I want this IT band to behave; carry on with active lifestyle  NEXT MD VISIT: in [place]   FUNCTIONAL OUTCOME MEASURE:  LEFS    69  /80  POSTURE:  static standing posture symmetrical  PALPATION: tenderness to palpation of proximal HS, trochanteric bursa  TRUNK ROM:  WFLs in all planes; +right LE neural tension   TRUNK STRENGTH:  Decreased activation of transverse abdominus muscles; abdominals 4-/5; decreased activation of lumbar multifidi; trunk extensors 4-/5  HIP ROM:  patient has full hip ROM with tendency toward hypermobility   STRENGTH:  right hip flexion 4+/5, hip abduction 4-/5; hip extension 4-/5 painful; knee flexion and extension 4+/5  SPECIAL TESTS:   Single leg stance test    5  sec on each side;  + Trendelenburg sign (contralateral hip drop)  FUNCTIONAL TESTS:   5X Sit to stand test  14.06 noted hip internal rotation/genu valgus/medial collapse  2 MWT 387 feet            GAIT: tends to externally rotate right LE  TREATMENT DATE: 10/18/23 SLS: 10 sec 5x each left more difficult than right "Toothpick  holder exercise" modified single leg RDL 5x right/left encouraged finger touch and slide hand down thigh for added stability 2 inch lateral step ups 5 sec hold 10x right/left (added to HEP) Standing with 4# weight in right hand with right knee hip flexion 5 sec hold 10x  Standing with 4# weight in left hand with left knee hip flexion 5 sec hold 10x Supine abdominal draw in with marching 10x Supine holding pair of 2# weights in hands: static hold of arms with marching Supine dead bug with 2# weights 10x (added to HEP) Patient education: 1-2 pillows under abdomen for prone hip extension to avoid lumbar hyperextension   TREATMENT DATE: 10/11/23 Review of  old HEP and discussed holding on these for now and will slowly reintegrate over time Sit to stand much improved patellofemoral alignment SLS: 10 sec 5x each left more difficult than right "Toothpick holder exercise" modified single leg RDL 5x right/left  Supine abdominal draw in with marching 10x Prone glute squeeze 10x Manual therapy: soft tissue mobilization to bil lumbar musculature and right gluteals Trigger Point Dry Needling Initial Treatment: Pt instructed on Dry Needling rational, procedures, and possible side effects. Pt instructed to expect mild to moderate muscle soreness later in the day and/or into the next day.  Pt instructed in methods to reduce muscle soreness. Pt instructed to continue prescribed HEP. Patient Verbal Consent Given: Yes Education Handout Provided: Previously Provided pt has had at this clinic numerous times over the years Muscles Treated: bil lumbar multifidi, right gluteals Electrical Stimulation Performed: No Treatment Response/Outcome: improved soft tissue mobility                                                                                                                TREATMENT DATE: 10/09/23 Evaluation   Discussed sleeping positioning and avoiding soft tissue compression Discussed car seat set up to avoid neural tension for prolonged periods Avoidance of crossing legs when sitting to avoid tissue compression Initial HEP: attention to patellofemoral alignment with sit to stand   PATIENT EDUCATION:  Education details: Educated patient on anatomy and physiology of current symptoms, prognosis, plan of care as well as initial self care strategies to promote recovery Person educated: Patient Education method: Explanation Education comprehension: verbalized understanding  HOME EXERCISE PROGRAM: Access Code: [facility] URL: [email or web address] Date: 10/18/2023 Prepared by: Darien Eden  Exercises - Prone Gluteal Sets  - 1 x daily  - 7 x weekly - 1 sets - 10 reps - 5 hold - Supine Bent Knee Foot Taps  - 1 x daily - 7 x weekly - 1 sets - 10 reps - Single Leg Stance  - 1 x daily - 7 x weekly - 1 sets - 5 reps - 5-10 hold - Sit to Stand  - 1 x daily - 7 x weekly - 1 sets - 5 reps - Lateral Step Up  - 1 x daily - 7 x weekly - 1 sets - 10 reps - 5 hold - Forward T with Counter Support  -  1 x daily - 7 x weekly - 1 sets - 10 reps - Dead Bug  - 1 x daily - 7 x weekly - 1 sets - 10 reps - Standing Marching  - 1 x daily - 7 x weekly - 1 sets - 10 reps - 5 hold  ASSESSMENT:  CLINICAL IMPRESSION: Abigail Pruitt is highly compliant with her HEP and feels very challenged with single leg modified dead lifts secondary to gluteal weakness and difficulty balancing.  Discussed modifications to improve her technique.  Updated and progressed her HEP while she is traveling out of state and will follow up on her return.       OBJECTIVE IMPAIRMENTS: decreased activity tolerance, decreased mobility, difficulty walking, decreased strength, increased fascial restrictions, impaired perceived functional ability, and pain.   ACTIVITY LIMITATIONS: squatting, sleeping, bed mobility, and locomotion level  PARTICIPATION LIMITATIONS: meal prep, cleaning, laundry, and driving  PERSONAL FACTORS: Time since onset of injury/illness/exacerbation and 1-2 comorbidities: spinal surgeries, knee pain are also affecting patient's functional outcome.   REHAB POTENTIAL: Good  CLINICAL DECISION MAKING: Stable/uncomplicated  EVALUATION COMPLEXITY: Low   GOALS: Goals reviewed with patient? Yes  SHORT TERM GOALS: Target date: 11/06/2023  The patient will demonstrate knowledge of basic self care strategies and exercises to promote healing  Baseline: Goal status: INITIAL  2.  The patient will have improved hip strength to at least 4/5 needed for standing, walking longer distances and descending stairs at home and in the community  Baseline:  Goal status:  INITIAL  3.  Improved quality of sleep by 30% Baseline:  Goal status: INITIAL  4.  30% improvement in pain with turning over in bed Baseline:  Goal status: INITIAL     LONG TERM GOALS: Target date: 12/04/2023   The patient will be independent in a safe self progression of a home exercise program to promote further recovery of function  Baseline:  Goal status: INITIAL  2.  The patient will have improved hip strength to at least 4+/5 needed for standing, walking longer distances and descending stairs at home and in the community Baseline:  Goal status: INITIAL  3.  Improved quality of sleep by 60% Baseline:  Goal status: INITIAL  4.  Patient will be able to return to a regular walking program  Baseline:  Goal status: INITIAL  5.  LEFS improve to   73  /80 indicating improved function with less pain Baseline:  Goal status: INITIAL    PLAN:  PT FREQUENCY: 2x/week  PT DURATION: 8 weeks  PLANNED INTERVENTIONS: 97164- PT Re-evaluation, 97110-Therapeutic exercises, 97530- Therapeutic activity, 97112- Neuromuscular re-education, 97535- Self Care, 16109- Manual therapy, [phone]- Aquatic Therapy, [id number]- Electrical stimulation (unattended), [phone]- Electrical stimulation (manual), 97035- Ultrasound, 91478- Ionotophoresis 4mg /ml Dexamethasone, Patient/Family education, Balance training, Stair training, Taping, Dry Needling, Joint mobilization, Spinal manipulation, Cryotherapy, and Moist heat  PLAN FOR NEXT SESSION:  follow up after her trip to [place] ; review and progress HEP; DN with ES to glueals, ITB, gluteal strengthening (isometrics)  Darien Eden, PT 10/18/23 12:29 PM Phone: [phone] Fax: [phone]

## 2023-10-23 ENCOUNTER — Encounter: Admitting: Physical Therapy

## 2023-10-31 ENCOUNTER — Ambulatory Visit: Admitting: Physical Therapy

## 2023-10-31 DIAGNOSIS — M25551 Pain in right hip: Secondary | ICD-10-CM | POA: Diagnosis not present

## 2023-10-31 DIAGNOSIS — M6281 Muscle weakness (generalized): Secondary | ICD-10-CM

## 2023-10-31 DIAGNOSIS — R252 Cramp and spasm: Secondary | ICD-10-CM

## 2023-10-31 NOTE — Therapy (Signed)
 OUTPATIENT PHYSICAL THERAPY LOWER EXTREMITY PROGRESS NOTE   Patient Name: Abigail Pruitt MRN: [id number] DOB:[birth year], 78 y.o., female Today's Date: 10/31/2023  END OF SESSION:  PT End of Session - 10/31/23 0932     Visit Number 4    Date for PT Re-Evaluation 12/04/23    Authorization Type Medicare/AARP    Progress Note Due on Visit 10    PT Start Time [phone]    PT Stop Time 1014    PT Time Calculation (min) 43 min    Activity Tolerance Patient tolerated treatment well             Past Medical History:  Diagnosis Date   Colon polyps    Family hx of colon cancer    Hot flashes    Severe hot flashes   Hyperlipidemia    Osteoarthritis    No past surgical history on file. 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  I feel like I'm starting over.  This exercise is aggravating it (single leg RDL modified) PERTINENT HISTORY: History of spinal stenosis with surgery 04/25/2023 L3-4 fusion, good response Knee gel injections 3x (better) Cervical fusion  PAIN:   Are you having pain? Yes NPRS scale: tight across low back 4/10 Pain location: stiffness in low back and right buttock Pain orientation: Right  PAIN TYPE: throbbing Pain description: intermittent  Relieving factors: fine during the day Aggravating factors:  I fight the bed all night long worse with either side; supine lying not good for breathing; my low back is tight;  lying on affected side, trouble walking b/c of right knee not this problem     PRECAUTIONS: None    WEIGHT BEARING  RESTRICTIONS: No  FALLS:  Has patient fallen in last 6 months? No  LIVING ENVIRONMENT: Lives with: lives with their spouse Lives in: House/apartment    OCCUPATION: retired  PLOF: Independent  PATIENT GOALS: I want this IT band to behave; carry on with active lifestyle  NEXT MD VISIT: in [place]   FUNCTIONAL OUTCOME MEASURE:  LEFS    69  /80  POSTURE:  static standing posture symmetrical  PALPATION: tenderness to palpation of proximal HS, trochanteric bursa  TRUNK ROM:  WFLs in all planes; +right LE neural tension   TRUNK STRENGTH:  Decreased activation of transverse abdominus muscles; abdominals 4-/5; decreased activation of lumbar multifidi; trunk extensors 4-/5  HIP ROM:  patient has full hip ROM with tendency toward hypermobility   STRENGTH:  right hip flexion 4+/5, hip abduction 4-/5; hip extension 4-/5 painful; knee flexion and extension 4+/5  SPECIAL TESTS:   Single leg stance test    5  sec on each side;  + Trendelenburg sign (contralateral hip drop)  FUNCTIONAL TESTS:   5X Sit to stand test  14.06 noted hip internal rotation/genu valgus/medial collapse  2 MWT 387 feet            GAIT: tends to externally rotate right LE  TREATMENT DATE: 10/31/23 Discussed activity modification and exercise modification during exacerbation including hold on single leg RDL "toothpick holder  ex" Weight bearing hip flexion ex's OK to do Dead bug ex's OK to do Prone glute squeeze OK to do Manual therapy: soft tissue mobilization to bil lumbar musculature and right gluteals Trigger Point Dry Needling Initial Treatment: Pt instructed on Dry Needling rational, procedures, and possible side effects. Pt instructed to expect mild to moderate muscle soreness later in the day and/or into the next day.  Pt instructed in methods to reduce muscle soreness. Pt instructed to continue prescribed HEP. Patient Verbal Consent Given: Yes Education Handout Provided: Previously Provided Muscles  Treated: bil lumbar multifidi, right gluteals marination Electrical Stimulation Performed: No Treatment Response/Outcome: improved soft tissue mobility  TREATMENT DATE: 10/18/23 SLS: 10 sec 5x each left more difficult than right "Toothpick holder exercise" modified single leg RDL 5x right/left encouraged finger touch and slide hand down thigh for added stability 2 inch lateral step ups 5 sec hold 10x right/left (added to HEP) Standing with 4# weight in right hand with right knee hip flexion 5 sec hold 10x  Standing with 4# weight in left hand with left knee hip flexion 5 sec hold 10x Supine abdominal draw in with marching 10x Supine holding pair of 2# weights in hands: static hold of arms with marching Supine dead bug with 2# weights 10x (added to HEP) Patient education: 1-2 pillows under abdomen for prone hip extension to avoid lumbar hyperextension   TREATMENT DATE: 10/11/23 Review of old HEP and discussed holding on these for now and will slowly reintegrate over time Sit to stand much improved patellofemoral alignment SLS: 10 sec 5x each left more difficult than right "Toothpick holder exercise" modified single leg RDL 5x right/left  Supine abdominal draw in with marching 10x Prone glute squeeze 10x Manual therapy: soft tissue mobilization to bil lumbar musculature and right gluteals Trigger Point Dry Needling Initial Treatment: Pt instructed on Dry Needling rational, procedures, and possible side effects. Pt instructed to expect mild to moderate muscle soreness later in the day and/or into the next day.  Pt instructed in methods to reduce muscle soreness. Pt instructed to continue prescribed HEP. Patient Verbal Consent Given: Yes Education Handout Provided: Previously Provided pt has had at this clinic numerous times over the years Muscles Treated: bil lumbar multifidi, right gluteals Electrical Stimulation Performed: No Treatment Response/Outcome: improved soft tissue mobility                                                                                                                 TREATMENT DATE: 10/09/23 Evaluation   Discussed sleeping positioning and avoiding soft tissue compression Discussed car seat set up to avoid neural tension for prolonged periods Avoidance of crossing legs when sitting to avoid tissue compression Initial HEP: attention to patellofemoral alignment with sit to stand   PATIENT EDUCATION:  Education details: Educated patient on anatomy and physiology of current symptoms, prognosis, plan of care as well as initial self care strategies to promote recovery Person educated: Patient Education method: Explanation Education comprehension: verbalized understanding  HOME EXERCISE PROGRAM: Access  Code: [facility] URL: [email or web address] Date: 10/18/2023 Prepared by: Darien Eden  Exercises - Prone Gluteal Sets  - 1 x daily - 7 x weekly - 1 sets - 10 reps - 5 hold - Supine Bent Knee Foot Taps  - 1 x daily - 7 x weekly - 1 sets - 10 reps - Single Leg Stance  - 1 x daily - 7 x weekly - 1 sets - 5 reps - 5-10 hold - Sit to Stand  - 1 x daily - 7 x weekly - 1 sets - 5 reps - Lateral Step Up  - 1 x daily - 7 x weekly - 1 sets - 10 reps - 5 hold - Forward T with Counter Support  - 1 x daily - 7 x weekly - 1 sets - 10 reps - Dead Bug  - 1 x daily - 7 x weekly - 1 sets - 10 reps - Standing Marching  - 1 x daily - 7 x weekly - 1 sets - 10 reps - 5 hold  ASSESSMENT:  CLINICAL IMPRESSION: Patient returns with an exacerbation of symptoms after long distance driving to and from [place] .  We discussed modifications to her HEP and areas to emphasize for symptom relief.  The patient responds well to dry needling and manual therapy to stimulate underlying myofascial trigger points and muscular tissue for management of neuromusculoskeletal pain and address movement impairments. She reports decreased lumbar stiffness post session.         OBJECTIVE IMPAIRMENTS: decreased activity tolerance, decreased mobility, difficulty walking, decreased strength, increased fascial restrictions, impaired perceived functional ability, and pain.   ACTIVITY LIMITATIONS: squatting, sleeping, bed mobility, and locomotion level  PARTICIPATION LIMITATIONS: meal prep, cleaning, laundry, and driving  PERSONAL FACTORS: Time since onset of injury/illness/exacerbation and 1-2 comorbidities: spinal surgeries, knee pain are also affecting patient's functional outcome.   REHAB POTENTIAL: Good  CLINICAL DECISION MAKING: Stable/uncomplicated  EVALUATION COMPLEXITY: Low   GOALS: Goals reviewed with patient? Yes  SHORT TERM GOALS: Target date: 11/06/2023  The patient will demonstrate knowledge of basic self care strategies and exercises to promote healing  Baseline: Goal status: INITIAL  2.  The patient will have improved hip strength to at least 4/5 needed for standing, walking longer distances and descending stairs at home and in the community  Baseline:  Goal status: INITIAL  3.  Improved quality of sleep by 30% Baseline:  Goal status: INITIAL  4.  30% improvement in pain with turning over in bed Baseline:  Goal status: INITIAL     LONG TERM GOALS: Target date: 12/04/2023   The patient will be independent in a safe self progression of a home exercise program to promote further recovery of function  Baseline:  Goal status: INITIAL  2.  The patient will have improved hip strength to at least 4+/5 needed for standing, walking longer distances and descending stairs at home and in the community Baseline:  Goal status: INITIAL  3.  Improved quality of sleep by 60% Baseline:  Goal status: INITIAL  4.  Patient will be able to return to a regular walking program  Baseline:  Goal status: INITIAL  5.  LEFS improve to   73  /80 indicating improved function with less pain Baseline:  Goal status: INITIAL    PLAN:  PT  FREQUENCY: 2x/week  PT DURATION: 8 weeks  PLANNED INTERVENTIONS: 97164- PT Re-evaluation, 97110-Therapeutic exercises, 97530- Therapeutic activity, 97112- Neuromuscular re-education, 97535- Self Care, 16109- Manual therapy, [id number]- Aquatic Therapy, [id number]-  Electrical stimulation (unattended), [id number]- Electrical stimulation (manual), 16109- Ultrasound, [id number]- Ionotophoresis 4mg /ml Dexamethasone, Patient/Family education, Balance training, Stair training, Taping, Dry Needling, Joint mobilization, Spinal manipulation, Cryotherapy, and Moist heat  PLAN FOR NEXT SESSION:  check STGs in 1 week; review and progress HEP; DN with ES to glueals, ITB, gluteal strengthening (isometrics)  Darien Eden, PT 10/31/23 3:45 PM Phone: [phone] Fax: [phone]

## 2023-11-05 ENCOUNTER — Ambulatory Visit: Admitting: Physical Therapy

## 2023-11-05 DIAGNOSIS — M25551 Pain in right hip: Secondary | ICD-10-CM | POA: Diagnosis not present

## 2023-11-05 DIAGNOSIS — M6281 Muscle weakness (generalized): Secondary | ICD-10-CM

## 2023-11-05 NOTE — Therapy (Signed)
 OUTPATIENT PHYSICAL THERAPY LOWER EXTREMITY PROGRESS NOTE   Patient Name: Abigail Pruitt MRN: [id number] DOB:08/29/1945, 78 y.o., female Today's Date: 11/05/2023  END OF SESSION:  PT End of Session - 11/05/23 0848     Visit Number 5    Date for PT Re-Evaluation 12/04/23    Authorization Type Medicare/[employer]    Progress Note Due on Visit 10    PT Start Time 0846    PT Stop Time 0927    PT Time Calculation (min) 41 min    Activity Tolerance Patient tolerated treatment well             Past Medical History:  Diagnosis Date   Colon polyps    Family hx of colon cancer    Hot flashes    Severe hot flashes   Hyperlipidemia    Osteoarthritis    No past surgical history on file. 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  I have to hang my leg off sometimes.  Will PT help this knee too?  PERTINENT HISTORY: History of spinal stenosis with surgery 04/25/2023 L3-4 fusion, good response Knee gel injections 3x (better) Cervical fusion  PAIN:   Are you having pain? Yes NPRS scale: tight across low back 4/10 Pain location: stiffness in low back and right buttock Pain orientation: Right  PAIN TYPE: throbbing Pain description: intermittent  Relieving factors: fine during the day Aggravating factors:  I fight the bed all night long worse with either side; supine lying not good for breathing; my low back is tight;  lying on affected side, trouble walking b/c of right knee not this problem     PRECAUTIONS: None    WEIGHT BEARING RESTRICTIONS: No  FALLS:  Has patient fallen  in last 6 months? No  LIVING ENVIRONMENT: Lives with: lives with their spouse Lives in: House/apartment    OCCUPATION: retired  PLOF: Independent  PATIENT GOALS: I want this IT band to behave; carry on with active lifestyle  NEXT MD VISIT: in [place]   FUNCTIONAL OUTCOME MEASURE:  LEFS    69  /80  POSTURE:  static standing posture symmetrical  PALPATION: tenderness to palpation of proximal HS, trochanteric bursa  TRUNK ROM:  WFLs in all planes; +right LE neural tension   TRUNK STRENGTH:  Decreased activation of transverse abdominus muscles; abdominals 4-/5; decreased activation of lumbar multifidi; trunk extensors 4-/5  HIP ROM:  patient has full hip ROM with tendency toward hypermobility   STRENGTH:  right hip flexion 4+/5, hip abduction 4-/5; hip extension 4-/5 painful; knee flexion and extension 4+/5 5/27: right hip abduction and extension 4/5  SPECIAL TESTS:   Single leg stance test    5  sec on each side;  + Trendelenburg sign (contralateral hip drop)  FUNCTIONAL TESTS:   5X Sit to stand test  14.06 noted hip internal rotation/genu valgus/medial collapse  2 MWT 387 feet            GAIT: tends to externally rotate right LE  TREATMENT DATE: 11/05/23 4 inch step ups 15x forward right/left (added to HEP) Knee  extension with green band isometric  10x right/left with 5 sec hold (added to HEP) Standing TKE medium purple power band 5 sec hold right only 10x Manual therapy: soft tissue mobilization to bil lumbar musculature and right gluteals Trigger Point Dry Needling Subsequent Treatment: Pt instructed on Dry Needling rational, procedures, and possible side effects. Pt instructed to expect mild to moderate muscle soreness later in the day and/or into the next day.  Pt instructed in methods to reduce muscle soreness. Pt instructed to continue prescribed HEP. Patient Verbal Consent Given: Yes Education Handout Provided: Previously Provided Muscles Treated: bil lumbar  multifidi, right gluteals  Electrical Stimulation Performed: yes 80 pps 1.5 ma 8 min right L5 and right gluteals Treatment Response/Outcome: improved soft tissue mobility   TREATMENT DATE: 10/31/23 Discussed activity modification and exercise modification during exacerbation including hold on single leg RDL "toothpick holder ex" Weight bearing hip flexion ex's OK to do Dead bug ex's OK to do Prone glute squeeze OK to do Manual therapy: soft tissue mobilization to bil lumbar musculature and right gluteals Trigger Point Dry Needling Follow up Treatment: Pt instructed on Dry Needling rational, procedures, and possible side effects. Pt instructed to expect mild to moderate muscle soreness later in the day and/or into the next day.  Pt instructed in methods to reduce muscle soreness. Pt instructed to continue prescribed HEP. Patient Verbal Consent Given: Yes Education Handout Provided: Previously Provided Muscles Treated: bil lumbar multifidi, right gluteals marination Electrical Stimulation Performed: No Treatment Response/Outcome: improved soft tissue mobility  TREATMENT DATE: 10/18/23 SLS: 10 sec 5x each left more difficult than right "Toothpick holder exercise" modified single leg RDL 5x right/left encouraged finger touch and slide hand down thigh for added stability 2 inch lateral step ups 5 sec hold 10x right/left (added to HEP) Standing with 4# weight in right hand with right knee hip flexion 5 sec hold 10x  Standing with 4# weight in left hand with left knee hip flexion 5 sec hold 10x Supine abdominal draw in with marching 10x Supine holding pair of 2# weights in hands: static hold of arms with marching Supine dead bug with 2# weights 10x (added to HEP) Patient education: 1-2 pillows under abdomen for prone hip extension to avoid lumbar hyperextension                  PATIENT EDUCATION:  Education details: Educated patient on anatomy and physiology of current symptoms, prognosis,  plan of care as well as initial self care strategies to promote recovery Person educated: Patient Education method: Explanation Education comprehension: verbalized understanding  HOME EXERCISE PROGRAM: Access Code: [email or web address] URL: [email or web address] Date: 11/05/2023 Prepared by: Darien Eden  Exercises - Prone Gluteal Sets  - 1 x daily - 7 x weekly - 1 sets - 10 reps - 5 hold - Supine Bent Knee Foot Taps  - 1 x daily - 7 x weekly - 1 sets - 10 reps - Single Leg Stance  - 1 x daily - 7 x weekly - 1 sets - 5 reps - 5-10 hold - Sit to Stand  - 1 x daily - 7 x weekly - 1 sets - 5 reps - Lateral Step Up  - 1 x daily - 7 x weekly - 1 sets - 10 reps - 5 hold - Forward T with Counter Support  - 1 x daily - 7 x weekly - 1 sets - 10 reps - Dead Bug  - 1 x daily - 7 x weekly -  1 sets - 10 reps - Standing Marching  - 1 x daily - 7 x weekly - 1 sets - 10 reps - 5 hold - Forward Step Up  - 1 x daily - 7 x weekly - 1 sets - 10 reps - Sitting Knee Extension with Resistance  - 1 x daily - 7 x weekly - 1 sets - 10 reps  ASSESSMENT:  CLINICAL IMPRESSION: Updated HEP to include quad strengthening (band isometrics, forward step ups).  Electrical stimulation added to indwelling needles for further impacts on pain relief and neuromuscular changes. Therapist monitoring response to all interventions and modifying treatment accordingly. Partial STGS met.       OBJECTIVE IMPAIRMENTS: decreased activity tolerance, decreased mobility, difficulty walking, decreased strength, increased fascial restrictions, impaired perceived functional ability, and pain.   ACTIVITY LIMITATIONS: squatting, sleeping, bed mobility, and locomotion level  PARTICIPATION LIMITATIONS: meal prep, cleaning, laundry, and driving  PERSONAL FACTORS: Time since onset of injury/illness/exacerbation and 1-2 comorbidities: spinal surgeries, knee pain are also affecting patient's functional outcome.   REHAB POTENTIAL:  Good  CLINICAL DECISION MAKING: Stable/uncomplicated  EVALUATION COMPLEXITY: Low   GOALS: Goals reviewed with patient? Yes  SHORT TERM GOALS: Target date: 11/06/2023  The patient will demonstrate knowledge of basic self care strategies and exercises to promote healing  Baseline: Goal status: met 5/27  2.  The patient will have improved hip strength to at least 4/5 needed for standing, walking longer distances and descending stairs at home and in the community  Baseline:  Goal status: met 5/27  3.  Improved quality of sleep by 30% Baseline:  Goal status: INITIAL  4.  30% improvement in pain with turning over in bed Baseline:  Goal status: INITIAL     LONG TERM GOALS: Target date: 12/04/2023   The patient will be independent in a safe self progression of a home exercise program to promote further recovery of function  Baseline:  Goal status: INITIAL  2.  The patient will have improved hip strength to at least 4+/5 needed for standing, walking longer distances and descending stairs at home and in the community Baseline:  Goal status: INITIAL  3.  Improved quality of sleep by 60% Baseline:  Goal status: INITIAL  4.  Patient will be able to return to a regular walking program  Baseline:  Goal status: INITIAL  5.  LEFS improve to   73  /80 indicating improved function with less pain Baseline:  Goal status: INITIAL    PLAN:  PT FREQUENCY: 2x/week  PT DURATION: 8 weeks  PLANNED INTERVENTIONS: 97164- PT Re-evaluation, 97110-Therapeutic exercises, 97530- Therapeutic activity, 97112- Neuromuscular re-education, 97535- Self Care, 16109- Manual therapy, [phone]- Aquatic Therapy, [id number]- Electrical stimulation (unattended), [phone]- Electrical stimulation (manual), [id number]- Ultrasound, 91478- Ionotophoresis 4mg /ml Dexamethasone, Patient/Family education, Balance training, Stair training, Taping, Dry Needling, Joint mobilization, Spinal manipulation, Cryotherapy, and Moist  heat  PLAN FOR NEXT SESSION: review and progress HEP; DN with ES to glueals, ITB, gluteal strengthening (isometrics)  Darien Eden, PT 11/05/23 2:04 PM Phone: [phone] Fax: [phone]

## 2023-11-07 ENCOUNTER — Ambulatory Visit: Admitting: Physical Therapy

## 2023-11-07 DIAGNOSIS — M25551 Pain in right hip: Secondary | ICD-10-CM | POA: Diagnosis not present

## 2023-11-07 DIAGNOSIS — M6281 Muscle weakness (generalized): Secondary | ICD-10-CM

## 2023-11-07 DIAGNOSIS — R252 Cramp and spasm: Secondary | ICD-10-CM

## 2023-11-07 NOTE — Therapy (Signed)
 OUTPATIENT PHYSICAL THERAPY LOWER EXTREMITY PROGRESS NOTE   Patient Name: Abigail Pruitt MRN: [id number] DOB:1945/12/17, 78 y.o., female Today's Date: 11/07/2023  END OF SESSION:  PT End of Session - 11/07/23 0939     Visit Number 6    Date for PT Re-Evaluation 12/04/23    Authorization Type Medicare/AARP    Progress Note Due on Visit 10    PT Start Time 0935    PT Stop Time 1015    PT Time Calculation (min) 40 min    Activity Tolerance Patient tolerated treatment well             Past Medical History:  Diagnosis Date   Colon polyps    Family hx of colon cancer    Hot flashes    Severe hot flashes   Hyperlipidemia    Osteoarthritis    No past surgical history on file. 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  Walked a mile.   PERTINENT HISTORY: History of spinal stenosis with surgery 04/25/2023 L3-4 fusion, good response Knee gel injections 3x (better) Cervical fusion  PAIN:   Are you having pain? Yes NPRS scale: tight across low back 2/10 Pain location: stiffness in low back and right buttock Pain orientation: Right  PAIN TYPE: throbbing Pain description: intermittent  Relieving factors: fine during the day Aggravating factors:  I fight the bed all night long worse with either side; supine lying not good for breathing; my low back is tight;  lying on affected side, trouble walking b/c of right knee not this problem     PRECAUTIONS: None    WEIGHT BEARING RESTRICTIONS: No  FALLS:  Has patient fallen in last 6 months? No  LIVING  ENVIRONMENT: Lives with: lives with their spouse Lives in: House/apartment    OCCUPATION: retired  PLOF: Independent  PATIENT GOALS: I want this IT band to behave; carry on with active lifestyle  NEXT MD VISIT: in [place]   FUNCTIONAL OUTCOME MEASURE:  LEFS    69  /80  POSTURE:  static standing posture symmetrical  PALPATION: tenderness to palpation of proximal HS, trochanteric bursa  TRUNK ROM:  WFLs in all planes; +right LE neural tension   TRUNK STRENGTH:  Decreased activation of transverse abdominus muscles; abdominals 4-/5; decreased activation of lumbar multifidi; trunk extensors 4-/5  HIP ROM:  patient has full hip ROM with tendency toward hypermobility   STRENGTH:  right hip flexion 4+/5, hip abduction 4-/5; hip extension 4-/5 painful; knee flexion and extension 4+/5 5/27: right hip abduction and extension 4/5  SPECIAL TESTS:   Single leg stance test    5  sec on each side;  + Trendelenburg sign (contralateral hip drop)  FUNCTIONAL TESTS:   5X Sit to stand test  14.06 noted hip internal rotation/genu valgus/medial collapse  2 MWT 387 feet            GAIT: tends to externally rotate right LE   TREATMENT DATE: 11/07/23 Review of status and response to current HEP Knee extension with green band isometric  10x right/left with  5 sec hold  Seated knee flexion green band HS 10x right/left with 5 sec hold (Added to HEP- see below) Standing TKE green band 5 sec hold right only 10x (Added to HEP- see below) Neuromuscular re-education to activate glute medius in standing: ball to wall hip abduction 5 sec hold 10x right/left; verbal cues for level pelvis/shoulders 4 inch lateral step ups 5 sec isometric hold 10x right/left    11/05/23 4 inch step ups 15x forward right/left (added to HEP) Knee extension with green band isometric  10x right/left with 5 sec hold (added to HEP) Standing TKE medium purple power band 5 sec hold right only 10x Manual therapy: soft tissue  mobilization to bil lumbar musculature and right gluteals Trigger Point Dry Needling Subsequent Treatment: Pt instructed on Dry Needling rational, procedures, and possible side effects. Pt instructed to expect mild to moderate muscle soreness later in the day and/or into the next day.  Pt instructed in methods to reduce muscle soreness. Pt instructed to continue prescribed HEP. Patient Verbal Consent Given: Yes Education Handout Provided: Previously Provided Muscles Treated: bil lumbar multifidi, right gluteals  Electrical Stimulation Performed: yes 80 pps 1.5 ma 8 min right L5 and right gluteals Treatment Response/Outcome: improved soft tissue mobility   TREATMENT DATE: 10/31/23 Discussed activity modification and exercise modification during exacerbation including hold on single leg RDL "toothpick holder ex" Weight bearing hip flexion ex's OK to do Dead bug ex's OK to do Prone glute squeeze OK to do Manual therapy: soft tissue mobilization to bil lumbar musculature and right gluteals Trigger Point Dry Needling Follow up Treatment: Pt instructed on Dry Needling rational, procedures, and possible side effects. Pt instructed to expect mild to moderate muscle soreness later in the day and/or into the next day.  Pt instructed in methods to reduce muscle soreness. Pt instructed to continue prescribed HEP. Patient Verbal Consent Given: Yes Education Handout Provided: Previously Provided Muscles Treated: bil lumbar multifidi, right gluteals marination Electrical Stimulation Performed: No Treatment Response/Outcome: improved soft tissue mobility  TREATMENT DATE: 10/18/23 SLS: 10 sec 5x each left more difficult than right "Toothpick holder exercise" modified single leg RDL 5x right/left encouraged finger touch and slide hand down thigh for added stability 2 inch lateral step ups 5 sec hold 10x right/left (added to HEP) Standing with 4# weight in right hand with right knee hip flexion 5 sec hold  10x  Standing with 4# weight in left hand with left knee hip flexion 5 sec hold 10x Supine abdominal draw in with marching 10x Supine holding pair of 2# weights in hands: static hold of arms with marching Supine dead bug with 2# weights 10x (added to HEP) Patient education: 1-2 pillows under abdomen for prone hip extension to avoid lumbar hyperextension                  PATIENT EDUCATION:  Education details: Educated patient on anatomy and physiology of current symptoms, prognosis, plan of care as well as initial self care strategies to promote recovery Person educated: Patient Education method: Explanation Education comprehension: verbalized understanding  HOME EXERCISE PROGRAM: Access Code: [email or web address] URL: [email or web address] Date: 11/07/2023 Prepared by: Darien Eden  Exercises - Prone Gluteal Sets  - 1 x daily - 7 x weekly - 1 sets - 10 reps - 5 hold - Supine Bent Knee Foot Taps  - 1 x daily - 7 x weekly - 1 sets - 10 reps - Single Leg Stance  - 1 x daily - 7 x  weekly - 1 sets - 5 reps - 5-10 hold - Sit to Stand  - 1 x daily - 7 x weekly - 1 sets - 5 reps - Lateral Step Up  - 1 x daily - 7 x weekly - 1 sets - 10 reps - 5 hold - Forward T with Counter Support  - 1 x daily - 7 x weekly - 1 sets - 10 reps - Dead Bug  - 1 x daily - 7 x weekly - 1 sets - 10 reps - Standing Marching  - 1 x daily - 7 x weekly - 1 sets - 10 reps - 5 hold - Forward Step Up  - 1 x daily - 7 x weekly - 1 sets - 10 reps - Sitting Knee Extension with Resistance  - 1 x daily - 7 x weekly - 1 sets - 10 reps - Seated Hamstring Curl with Anchored Resistance  - 1 x daily - 7 x weekly - 1 sets - 10 reps - 5 hold - Standing Terminal Knee Extension with Resistance  - 1 x daily - 7 x weekly - 1 sets - 10 reps - 5 hold - Standing Isometric Hip Abduction with Ball on Wall  - 1 x daily - 7 x weekly - 1 sets - 5 reps - 5 hold  ASSESSMENT:  CLINICAL IMPRESSION: HEP continues to be updated with  progressions of exercise intensity and challenge level.  Verbal cues for level pelvis, trunk and shoulders to engage glute medius muscle in single limb stance position.  Progressing with rehab goals.    OBJECTIVE IMPAIRMENTS: decreased activity tolerance, decreased mobility, difficulty walking, decreased strength, increased fascial restrictions, impaired perceived functional ability, and pain.   ACTIVITY LIMITATIONS: squatting, sleeping, bed mobility, and locomotion level  PARTICIPATION LIMITATIONS: meal prep, cleaning, laundry, and driving  PERSONAL FACTORS: Time since onset of injury/illness/exacerbation and 1-2 comorbidities: spinal surgeries, knee pain are also affecting patient's functional outcome.   REHAB POTENTIAL: Good  CLINICAL DECISION MAKING: Stable/uncomplicated  EVALUATION COMPLEXITY: Low   GOALS: Goals reviewed with patient? Yes  SHORT TERM GOALS: Target date: 11/06/2023  The patient will demonstrate knowledge of basic self care strategies and exercises to promote healing  Baseline: Goal status: met 5/27  2.  The patient will have improved hip strength to at least 4/5 needed for standing, walking longer distances and descending stairs at home and in the community  Baseline:  Goal status: met 5/27  3.  Improved quality of sleep by 30% Baseline:  Goal status: met 5/29  4.  30% improvement in pain with turning over in bed Baseline:  Goal status: met 5/29     LONG TERM GOALS: Target date: 12/04/2023   The patient will be independent in a safe self progression of a home exercise program to promote further recovery of function  Baseline:  Goal status: INITIAL  2.  The patient will have improved hip strength to at least 4+/5 needed for standing, walking longer distances and descending stairs at home and in the community Baseline:  Goal status: INITIAL  3.  Improved quality of sleep by 60% Baseline:  Goal status: INITIAL  4.  Patient will be able to  return to a regular walking program  Baseline:  Goal status: INITIAL  5.  LEFS improve to   73  /80 indicating improved function with less pain Baseline:  Goal status: INITIAL    PLAN:  PT FREQUENCY: 2x/week  PT DURATION: 8 weeks  PLANNED  INTERVENTIONS: 97164- PT Re-evaluation, 97110-Therapeutic exercises, 97530- Therapeutic activity, [id number]- Neuromuscular re-education, [phone]- Self Care, 60454- Manual therapy, [phone]- Aquatic Therapy, [phone]- Electrical stimulation (unattended), [phone]- Electrical stimulation (manual), [id number]- Ultrasound, 13086- Ionotophoresis 4mg /ml Dexamethasone, Patient/Family education, Balance training, Stair training, Taping, Dry Needling, Joint mobilization, Spinal manipulation, Cryotherapy, and Moist heat  PLAN FOR NEXT SESSION: review and progress HEP; DN with ES to glueals, ITB, gluteal strengthening   Darien Eden, PT 11/07/23 9:40 AM Phone: [phone] Fax: [phone]

## 2023-11-12 ENCOUNTER — Ambulatory Visit: Admitting: Physical Therapy

## 2023-11-12 DIAGNOSIS — M6281 Muscle weakness (generalized): Secondary | ICD-10-CM | POA: Diagnosis present

## 2023-11-12 DIAGNOSIS — R252 Cramp and spasm: Secondary | ICD-10-CM | POA: Diagnosis present

## 2023-11-12 DIAGNOSIS — M25551 Pain in right hip: Secondary | ICD-10-CM | POA: Diagnosis present

## 2023-11-12 NOTE — Therapy (Signed)
 OUTPATIENT PHYSICAL THERAPY LOWER EXTREMITY PROGRESS NOTE   Patient Name: Abigail Pruitt MRN: [id number] DOB:03-03-1946, 78 y.o., female Today's Date: 11/12/2023  END OF SESSION:  PT End of Session - 11/12/23 0844     Visit Number 7    Date for PT Re-Evaluation 12/04/23    Authorization Type Medicare/AARP    Progress Note Due on Visit 10    PT Start Time 0845    PT Stop Time 0925    PT Time Calculation (min) 40 min    Activity Tolerance Patient tolerated treatment well             Past Medical History:  Diagnosis Date   Colon polyps    Family hx of colon cancer    Hot flashes    Severe hot flashes   Hyperlipidemia    Osteoarthritis    No past surgical history on file. 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  Sometimes I feel like I can't do the ex's b/c of the knees but then it gets better once I move around. I'm going to cancel Friday b/c my husband's hip has gone out.  He's going to fly down there.  He's probably going to need a hip replacement.  I'm having a colonoscopy this week.     PERTINENT HISTORY: History of spinal stenosis with surgery 04/25/2023 L3-4 fusion, good response Knee gel injections 3x (better) Cervical fusion  PAIN:   Are you having pain? Yes NPRS scale:  2/10 Pain location: stiffness in low back and right buttock Pain orientation: Right  PAIN TYPE: throbbing Pain description: intermittent  Relieving factors: fine during the day Aggravating factors:  I fight the bed all night long worse with either side; supine lying not good for breathing; my low  back is tight;  lying on affected side, trouble walking b/c of right knee not this problem     PRECAUTIONS: None    WEIGHT BEARING RESTRICTIONS: No  FALLS:  Has patient fallen in last 6 months? No  LIVING ENVIRONMENT: Lives with: lives with their spouse Lives in: House/apartment    OCCUPATION: retired  PLOF: Independent  PATIENT GOALS: I want this IT band to behave; carry on with active lifestyle  NEXT MD VISIT: in [place]   FUNCTIONAL OUTCOME MEASURE:  LEFS    69  /80  POSTURE:  static standing posture symmetrical  PALPATION: tenderness to palpation of proximal HS, trochanteric bursa  TRUNK ROM:  WFLs in all planes; +right LE neural tension   TRUNK STRENGTH:  Decreased activation of transverse abdominus muscles; abdominals 4-/5; decreased activation of lumbar multifidi; trunk extensors 4-/5  HIP ROM:  patient has full hip ROM with tendency toward hypermobility   STRENGTH:  right hip flexion 4+/5, hip abduction 4-/5; hip extension 4-/5 painful; knee flexion and extension 4+/5 5/27: right hip abduction and extension 4/5  SPECIAL TESTS:   Single leg stance test    5  sec on each side;  + Trendelenburg sign (contralateral hip drop)  FUNCTIONAL TESTS:   5X Sit to stand test  14.06 noted hip internal rotation/genu valgus/medial collapse  2 MWT 387 feet            GAIT: tends to externally rotate right LE  TREATMENT DATE: 11/12/23 SLS right/left 9-10 sec  WB on 1 leg holding 8# with same side stool taps with tip of toes 15x 2 right/left Neuromuscular re-education to activate glute medius in standing: ball to wall hip abduction 5 sec hold 10x right/left; verbal cues for level pelvis/shoulders Knee extension with green band isometric  10x right/left with 5 sec hold  Seated knee flexion green band HS 10x right/left with 5 sec hold  4 inch lateral step ups 5 sec isometric hold emphasis and instruction in added hip abduction at the top   TREATMENT DATE: 11/07/23 Review of  status and response to current HEP Knee extension with green band isometric  10x right/left with 5 sec hold  Seated knee flexion green band HS 10x right/left with 5 sec hold (Added to HEP- see below) Standing TKE green band 5 sec hold right only 10x (Added to HEP- see below) Neuromuscular re-education to activate glute medius in standing: ball to wall hip abduction 5 sec hold 10x right/left; verbal cues for level pelvis/shoulders 4 inch lateral step ups 5 sec isometric hold 10x right/left    11/05/23 4 inch step ups 15x forward right/left (added to HEP) Knee extension with green band isometric  10x right/left with 5 sec hold (added to HEP) Standing TKE medium purple power band 5 sec hold right only 10x Manual therapy: soft tissue mobilization to bil lumbar musculature and right gluteals Trigger Point Dry Needling Subsequent Treatment: Pt instructed on Dry Needling rational, procedures, and possible side effects. Pt instructed to expect mild to moderate muscle soreness later in the day and/or into the next day.  Pt instructed in methods to reduce muscle soreness. Pt instructed to continue prescribed HEP. Patient Verbal Consent Given: Yes Education Handout Provided: Previously Provided Muscles Treated: bil lumbar multifidi, right gluteals  Electrical Stimulation Performed: yes 80 pps 1.5 ma 8 min right L5 and right gluteals Treatment Response/Outcome: improved soft tissue mobility   TREATMENT DATE: 10/31/23 Discussed activity modification and exercise modification during exacerbation including hold on single leg RDL "toothpick holder ex" Weight bearing hip flexion ex's OK to do Dead bug ex's OK to do Prone glute squeeze OK to do Manual therapy: soft tissue mobilization to bil lumbar musculature and right gluteals Trigger Point Dry Needling Follow up Treatment: Pt instructed on Dry Needling rational, procedures, and possible side effects. Pt instructed to expect mild to moderate muscle  soreness later in the day and/or into the next day.  Pt instructed in methods to reduce muscle soreness. Pt instructed to continue prescribed HEP. Patient Verbal Consent Given: Yes Education Handout Provided: Previously Provided Muscles Treated: bil lumbar multifidi, right gluteals marination Electrical Stimulation Performed: No Treatment Response/Outcome: improved soft tissue mobility                  PATIENT EDUCATION:  Education details: Educated patient on anatomy and physiology of current symptoms, prognosis, plan of care as well as initial self care strategies to promote recovery Person educated: Patient Education method: Explanation Education comprehension: verbalized understanding  HOME EXERCISE PROGRAM: Access Code: [email or web address] URL: [email or web address] Date: 11/07/2023 Prepared by: Darien Eden  Exercises - Prone Gluteal Sets  - 1 x daily - 7 x weekly - 1 sets - 10 reps - 5 hold - Supine Bent Knee Foot Taps  - 1 x daily - 7 x weekly - 1  sets - 10 reps - Single Leg Stance  - 1 x daily - 7 x weekly - 1 sets - 5 reps - 5-10 hold - Sit to Stand  - 1 x daily - 7 x weekly - 1 sets - 5 reps - Lateral Step Up  - 1 x daily - 7 x weekly - 1 sets - 10 reps - 5 hold - Forward T with Counter Support  - 1 x daily - 7 x weekly - 1 sets - 10 reps - Dead Bug  - 1 x daily - 7 x weekly - 1 sets - 10 reps - Standing Marching  - 1 x daily - 7 x weekly - 1 sets - 10 reps - 5 hold - Forward Step Up  - 1 x daily - 7 x weekly - 1 sets - 10 reps - Sitting Knee Extension with Resistance  - 1 x daily - 7 x weekly - 1 sets - 10 reps - Seated Hamstring Curl with Anchored Resistance  - 1 x daily - 7 x weekly - 1 sets - 10 reps - 5 hold - Standing Terminal Knee Extension with Resistance  - 1 x daily - 7 x weekly - 1 sets - 10 reps - 5 hold - Standing Isometric Hip Abduction with Ball on Wall  - 1 x daily - 7 x weekly - 1 sets - 5 reps - 5 hold  ASSESSMENT:  CLINICAL IMPRESSION: Pain  level remains relatively low throughout session.  She does report some knee stiffness after working on hip strength and stabilization but this improves with resisted knee strengthening with the band.  Therapist providing verbal cues to optimize technique with exercises in order to achieve the greatest benefit.  On track to meet long term goals.       OBJECTIVE IMPAIRMENTS: decreased activity tolerance, decreased mobility, difficulty walking, decreased strength, increased fascial restrictions, impaired perceived functional ability, and pain.   ACTIVITY LIMITATIONS: squatting, sleeping, bed mobility, and locomotion level  PARTICIPATION LIMITATIONS: meal prep, cleaning, laundry, and driving  PERSONAL FACTORS: Time since onset of injury/illness/exacerbation and 1-2 comorbidities: spinal surgeries, knee pain are also affecting patient's functional outcome.   REHAB POTENTIAL: Good  CLINICAL DECISION MAKING: Stable/uncomplicated  EVALUATION COMPLEXITY: Low   GOALS: Goals reviewed with patient? Yes  SHORT TERM GOALS: Target date: 11/06/2023  The patient will demonstrate knowledge of basic self care strategies and exercises to promote healing  Baseline: Goal status: met 5/27  2.  The patient will have improved hip strength to at least 4/5 needed for standing, walking longer distances and descending stairs at home and in the community  Baseline:  Goal status: met 5/27  3.  Improved quality of sleep by 30% Baseline:  Goal status: met 5/29  4.  30% improvement in pain with turning over in bed Baseline:  Goal status: met 5/29     LONG TERM GOALS: Target date: 12/04/2023   The patient will be independent in a safe self progression of a home exercise program to promote further recovery of function  Baseline:  Goal status: INITIAL  2.  The patient will have improved hip strength to at least 4+/5 needed for standing, walking longer distances and descending stairs at home and in the  community Baseline:  Goal status: INITIAL  3.  Improved quality of sleep by 60% Baseline:  Goal status: INITIAL  4.  Patient will be able to return to a regular walking program  Baseline:  Goal status:  INITIAL  5.  LEFS improve to   73  /80 indicating improved function with less pain Baseline:  Goal status: INITIAL    PLAN:  PT FREQUENCY: 2x/week  PT DURATION: 8 weeks  PLANNED INTERVENTIONS: 97164- PT Re-evaluation, 97110-Therapeutic exercises, 97530- Therapeutic activity, 97112- Neuromuscular re-education, 97535- Self Care, 16109- Manual therapy, [phone]- Aquatic Therapy, [id number]- Electrical stimulation (unattended), [phone]- Electrical stimulation (manual), [id number]- Ultrasound, 91478- Ionotophoresis 4mg /ml Dexamethasone, Patient/Family education, Balance training, Stair training, Taping, Dry Needling, Joint mobilization, Spinal manipulation, Cryotherapy, and Moist heat  PLAN FOR NEXT SESSION: review and progress HEP; DN with ES to glueals, ITB, gluteal strengthening   Darien Eden, PT 11/12/23 10:32 AM Phone: [phone] Fax: [phone]

## 2023-11-15 ENCOUNTER — Ambulatory Visit: Admitting: Physical Therapy

## 2023-11-19 ENCOUNTER — Ambulatory Visit: Admitting: Physical Therapy

## 2023-11-19 DIAGNOSIS — M25551 Pain in right hip: Secondary | ICD-10-CM | POA: Diagnosis not present

## 2023-11-19 DIAGNOSIS — M6281 Muscle weakness (generalized): Secondary | ICD-10-CM

## 2023-11-19 NOTE — Therapy (Signed)
 OUTPATIENT PHYSICAL THERAPY LOWER EXTREMITY PROGRESS NOTE   Patient Name: Abigail Pruitt MRN: [id number] DOB:1945-10-14, 78 y.o., female Today's Date: 11/19/2023  END OF SESSION:  PT End of Session - 11/19/23 0845     Visit Number 8    Date for PT Re-Evaluation 12/04/23    Authorization Type Medicare/AARP    Progress Note Due on Visit 10    PT Start Time 0845    PT Stop Time 0926    PT Time Calculation (min) 41 min    Activity Tolerance Patient tolerated treatment well             Past Medical History:  Diagnosis Date   Colon polyps    Family hx of colon cancer    Hot flashes    Severe hot flashes   Hyperlipidemia    Osteoarthritis    No past surgical history on file. 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  Now the left knee is bothering me instead of the right.  I lather the Voltaren.  Trying to keep up with the walking.  Sleeping better with Gabapentin and muscle relaxers.    PERTINENT HISTORY: History of spinal stenosis with surgery 04/25/2023 L3-4 fusion, good response Knee gel injections 3x (better) Cervical fusion  PAIN:   Are you having pain? Yes NPRS scale:  1/10 Pain location: both knees; stiffness in low back and right buttock Pain orientation: Right  PAIN TYPE: throbbing Pain description: intermittent  Relieving factors: fine during the day Aggravating factors:  I fight the bed all night long worse with either side; supine lying not good for breathing; my low back is tight;  lying on affected side, trouble walking b/c of right knee not this problem      PRECAUTIONS: None    WEIGHT BEARING RESTRICTIONS: No  FALLS:  Has patient fallen in last 6 months? No  LIVING ENVIRONMENT: Lives with: lives with their spouse Lives in: House/apartment    OCCUPATION: retired  PLOF: Independent  PATIENT GOALS: I want this IT band to behave; carry on with active lifestyle  NEXT MD VISIT: in [place]   FUNCTIONAL OUTCOME MEASURE:  LEFS    69  /80  POSTURE:  static standing posture symmetrical  PALPATION: tenderness to palpation of proximal HS, trochanteric bursa  TRUNK ROM:  WFLs in all planes; +right LE neural tension   TRUNK STRENGTH:  Decreased activation of transverse abdominus muscles; abdominals 4-/5; decreased activation of lumbar multifidi; trunk extensors 4-/5  HIP ROM:  patient has full hip ROM with tendency toward hypermobility   STRENGTH:  right hip flexion 4+/5, hip abduction 4-/5; hip extension 4-/5 painful; knee flexion and extension 4+/5 5/27: right hip abduction and extension 4/5  SPECIAL TESTS:   Single leg stance test    5  sec on each side;  + Trendelenburg sign (contralateral hip drop)  FUNCTIONAL TESTS:   5X Sit to stand test  14.06 noted hip internal rotation/genu valgus/medial collapse  2 MWT 387 feet            GAIT: tends to externally rotate right LE  TREATMENT DATE: 11/19/23 Status update on current HEP and activity level Hip 3 ways pink heavy loop 10x right/left WB on 1 leg holding 8# with same side stool taps with tip of toes 15x 2 right/left Knee extension with green band isometric  10x right/left with 5 sec hold  Seated knee flexion green band HS 10x right/left with 5 sec hold  4 inch lateral step ups with opposite hip abduction at the top 10x right/left Hip extension isometric against the wall 5 sec hold 10x right/left  TREATMENT DATE: 11/12/23 SLS right/left 9-10 sec  WB on 1 leg holding 8# with same side stool taps with tip of toes 15x 2 right/left Neuromuscular re-education to activate glute  medius in standing: ball to wall hip abduction 5 sec hold 10x right/left; verbal cues for level pelvis/shoulders Knee extension with green band isometric  10x right/left with 5 sec hold  Seated knee flexion green band HS 10x right/left with 5 sec hold  4 inch lateral step ups 5 sec isometric hold emphasis and instruction in added hip abduction at the top   TREATMENT DATE: 11/07/23 Review of status and response to current HEP Knee extension with green band isometric  10x right/left with 5 sec hold  Seated knee flexion green band HS 10x right/left with 5 sec hold (Added to HEP- see below) Standing TKE green band 5 sec hold right only 10x (Added to HEP- see below) Neuromuscular re-education to activate glute medius in standing: ball to wall hip abduction 5 sec hold 10x right/left; verbal cues for level pelvis/shoulders 4 inch lateral step ups 5 sec isometric hold 10x right/left    11/05/23 4 inch step ups 15x forward right/left (added to HEP) Knee extension with green band isometric  10x right/left with 5 sec hold (added to HEP) Standing TKE medium purple power band 5 sec hold right only 10x Manual therapy: soft tissue mobilization to bil lumbar musculature and right gluteals Trigger Point Dry Needling Subsequent Treatment: Pt instructed on Dry Needling rational, procedures, and possible side effects. Pt instructed to expect mild to moderate muscle soreness later in the day and/or into the next day.  Pt instructed in methods to reduce muscle soreness. Pt instructed to continue prescribed HEP. Patient Verbal Consent Given: Yes Education Handout Provided: Previously Provided Muscles Treated: bil lumbar multifidi, right gluteals  Electrical Stimulation Performed: yes 80 pps 1.5 ma 8 min right L5 and right gluteals Treatment Response/Outcome: improved soft tissue mobility                 PATIENT EDUCATION:  Education details: Educated patient on anatomy and physiology of current symptoms,  prognosis, plan of care as well as initial self care strategies to promote recovery Person educated: Patient Education method: Explanation Education comprehension: verbalized understanding  HOME EXERCISE PROGRAM: Access Code: [email or web address] URL: [email or web address] Date: 11/07/2023 Prepared by: Darien Eden  Exercises - Prone Gluteal Sets  - 1 x daily - 7 x weekly - 1 sets - 10 reps - 5 hold - Supine Bent Knee Foot Taps  - 1 x daily - 7 x weekly - 1 sets - 10 reps - Single Leg Stance  - 1 x daily - 7 x weekly - 1 sets - 5 reps - 5-10 hold - Sit to Stand  - 1 x daily - 7 x weekly - 1 sets - 5 reps - Lateral Step Up  - 1 x daily - 7 x weekly - 1 sets - 10 reps - 5 hold -  Forward T with Counter Support  - 1 x daily - 7 x weekly - 1 sets - 10 reps - Dead Bug  - 1 x daily - 7 x weekly - 1 sets - 10 reps - Standing Marching  - 1 x daily - 7 x weekly - 1 sets - 10 reps - 5 hold - Forward Step Up  - 1 x daily - 7 x weekly - 1 sets - 10 reps - Sitting Knee Extension with Resistance  - 1 x daily - 7 x weekly - 1 sets - 10 reps - Seated Hamstring Curl with Anchored Resistance  - 1 x daily - 7 x weekly - 1 sets - 10 reps - 5 hold - Standing Terminal Knee Extension with Resistance  - 1 x daily - 7 x weekly - 1 sets - 10 reps - 5 hold - Standing Isometric Hip Abduction with Ball on Wall  - 1 x daily - 7 x weekly - 1 sets - 5 reps - 5 hold  ASSESSMENT:  CLINICAL IMPRESSION: The patient has been less symptomatic over past 2 weeks suggesting improved pain control as strength progresses.  Treatment focus on gluteal strengthening needed for longer duration standing and walking.  Therapist providing verbal cues to optimize technique with exercises in order to achieve the greatest benefit.  Reduce treatment frequency to 1x/week.       OBJECTIVE IMPAIRMENTS: decreased activity tolerance, decreased mobility, difficulty walking, decreased strength, increased fascial restrictions, impaired  perceived functional ability, and pain.   ACTIVITY LIMITATIONS: squatting, sleeping, bed mobility, and locomotion level  PARTICIPATION LIMITATIONS: meal prep, cleaning, laundry, and driving  PERSONAL FACTORS: Time since onset of injury/illness/exacerbation and 1-2 comorbidities: spinal surgeries, knee pain are also affecting patient's functional outcome.   REHAB POTENTIAL: Good  CLINICAL DECISION MAKING: Stable/uncomplicated  EVALUATION COMPLEXITY: Low   GOALS: Goals reviewed with patient? Yes  SHORT TERM GOALS: Target date: 11/06/2023  The patient will demonstrate knowledge of basic self care strategies and exercises to promote healing  Baseline: Goal status: met 5/27  2.  The patient will have improved hip strength to at least 4/5 needed for standing, walking longer distances and descending stairs at home and in the community  Baseline:  Goal status: met 5/27  3.  Improved quality of sleep by 30% Baseline:  Goal status: met 5/29  4.  30% improvement in pain with turning over in bed Baseline:  Goal status: met 5/29     LONG TERM GOALS: Target date: 12/04/2023   The patient will be independent in a safe self progression of a home exercise program to promote further recovery of function  Baseline:  Goal status: INITIAL  2.  The patient will have improved hip strength to at least 4+/5 needed for standing, walking longer distances and descending stairs at home and in the community Baseline:  Goal status: INITIAL  3.  Improved quality of sleep by 60% Baseline:  Goal status: INITIAL  4.  Patient will be able to return to a regular walking program  Baseline:  Goal status: INITIAL  5.  LEFS improve to   73  /80 indicating improved function with less pain Baseline:  Goal status: INITIAL    PLAN:  PT FREQUENCY: 2x/week  PT DURATION: 8 weeks  PLANNED INTERVENTIONS: 97164- PT Re-evaluation, 97110-Therapeutic exercises, 97530- Therapeutic activity, 97112-  Neuromuscular re-education, 97535- Self Care, 16109- Manual therapy, [id number]- Aquatic Therapy, U0454- Electrical stimulation (unattended), [id number]- Electrical stimulation (manual), [id number]- Ultrasound, 09811- Ionotophoresis  4mg /ml Dexamethasone, Patient/Family education, Balance training, Stair training, Taping, Dry Needling, Joint mobilization, Spinal manipulation, Cryotherapy, and Moist heat  PLAN FOR NEXT SESSION: review and progress HEP; DN with ES to glueals, ITB, gluteal strengthening; decrease frequency to 1x/week  Darien Eden, PT 11/19/23 6:47 PM Phone: [phone] Fax: [phone]

## 2023-11-21 ENCOUNTER — Ambulatory Visit: Admitting: Physical Therapy

## 2023-11-28 ENCOUNTER — Ambulatory Visit: Admitting: Physical Therapy

## 2023-12-03 ENCOUNTER — Ambulatory Visit: Admitting: Physical Therapy

## 2023-12-03 DIAGNOSIS — M6281 Muscle weakness (generalized): Secondary | ICD-10-CM

## 2023-12-03 DIAGNOSIS — M25551 Pain in right hip: Secondary | ICD-10-CM

## 2023-12-03 DIAGNOSIS — R252 Cramp and spasm: Secondary | ICD-10-CM

## 2023-12-03 NOTE — Therapy (Signed)
 OUTPATIENT PHYSICAL THERAPY LOWER EXTREMITY PROGRESS NOTE/RECERTIFICATION   Patient Name: Abigail Pruitt MRN: [id number] DOB:[birth year], 78 y.o., female Today's Date: 12/03/2023  END OF SESSION:  PT End of Session - 12/03/23 0852     Visit Number 9    Date for PT Re-Evaluation 02/25/24    Authorization Type Medicare/[employer]ARP    Progress Note Due on Visit 10    PT Start Time 0850    PT Stop Time 0930    PT Time Calculation (min) 40 min    Activity Tolerance Patient tolerated treatment well          Past Medical History:  Diagnosis Date   Colon polyps    Family hx of colon cancer    Hot flashes    Severe hot flashes   Hyperlipidemia    Osteoarthritis    No past surgical history on file. Patient Active Problem List   Diagnosis Date Noted   Other low back pain 10/23/2021   Pain in right hip 10/23/2021    PCP: not in system  REFERRING PROVIDER: Silva Moats APRN  REFERRING DIAG: M70.61, M76.31 right trochanteric bursa and iliotibial tract  THERAPY DIAG:  Right hip pain, weakness Rationale for Evaluation and Treatment: Rehabilitation  ONSET DATE: 6 months  SUBJECTIVE:   SUBJECTIVE STATEMENT: I cancelled last time b/c I couldn't bear weight on the left knee.  Got an injection in left knee (kind of high up).  The hip is better.  It's not keeping me up  Husband having THR in July in [place]    PERTINENT HISTORY: History of spinal stenosis with surgery 04/25/2023 L3-4 fusion, good response Knee gel injections 3x (better) Cervical fusion  PAIN:   Are you having pain? Yes NPRS scale:  1/10 Pain location: left knee; Pain orientation: Right  PAIN TYPE: throbbing Pain description: intermittent  Relieving factors: fine during the day Aggravating factors:  I fight the bed all night long worse with either side; supine lying not good for breathing; my low back is tight;  lying on affected side, trouble walking b/c of right knee not this problem     PRECAUTIONS:  None    WEIGHT BEARING RESTRICTIONS: No  FALLS:  Has patient fallen in last 6 months? No  LIVING ENVIRONMENT: Lives with: lives with their spouse Lives in: House/apartment    OCCUPATION: retired  PLOF: Independent  PATIENT GOALS: I want this IT band to behave; carry on with active lifestyle  NEXT MD VISIT: in [place]   FUNCTIONAL OUTCOME MEASURE:  LEFS    69  /80 6/24:  55/80  POSTURE:  static standing posture symmetrical  PALPATION: tenderness to palpation of proximal HS, trochanteric bursa  TRUNK ROM:  WFLs in all planes; +right LE neural tension   TRUNK STRENGTH:  Decreased activation of transverse abdominus muscles; abdominals 4-/5; decreased activation of lumbar multifidi; trunk extensors 4-/5  HIP ROM:  patient has full hip ROM with tendency toward hypermobility   STRENGTH:  right hip flexion 4+/5, hip abduction 4-/5; hip extension 4-/5 painful; knee flexion and extension 4+/5 5/27: right hip abduction and extension 4/5 6/24: right hip abduction 4/5 and extension 4+/5  SPECIAL TESTS:   Single leg stance test    5  sec on each side;  + Trendelenburg sign (contralateral hip drop)  FUNCTIONAL TESTS:   5X Sit to stand test  14.06 noted hip internal rotation/genu valgus/medial collapse  2 MWT 387 feet            GAIT:  tends to externally rotate right LE  TREATMENT DATE: 12/03/23 Status update on current HEP and activity level LEFS Discussed recumbent stationary bike and Elliptical  Hip 3 ways double blue band (given for home use)  10x right/left (Added to HEP- see below) 4 inch lateral step ups with opposite hip abduction at the top 10x right/left (Added to HEP- see below) Hip extension isometric against the wall 5 sec hold 10x right/left  (Added to HEP- see below)  TREATMENT DATE: 11/19/23 Status update on current HEP and activity level Hip 3 ways pink heavy loop 10x right/left WB on 1 leg holding 8# with same side stool taps with tip of toes 15x 2  right/left Knee extension with green band isometric  10x right/left with 5 sec hold  Seated knee flexion green band HS 10x right/left with 5 sec hold  4 inch lateral step ups with opposite hip abduction at the top 10x right/left Hip extension isometric against the wall 5 sec hold 10x right/left  TREATMENT DATE: 11/12/23 SLS right/left 9-10 sec  WB on 1 leg holding 8# with same side stool taps with tip of toes 15x 2 right/left Neuromuscular re-education to activate glute medius in standing: ball to wall hip abduction 5 sec hold 10x right/left; verbal cues for level pelvis/shoulders Knee extension with green band isometric  10x right/left with 5 sec hold  Seated knee flexion green band HS 10x right/left with 5 sec hold  4 inch lateral step ups 5 sec isometric hold emphasis and instruction in added hip abduction at the top   TREATMENT DATE: 11/07/23 Review of status and response to current HEP Knee extension with green band isometric  10x right/left with 5 sec hold  Seated knee flexion green band HS 10x right/left with 5 sec hold (Added to HEP- see below) Standing TKE green band 5 sec hold right only 10x (Added to HEP- see below) Neuromuscular re-education to activate glute medius in standing: ball to wall hip abduction 5 sec hold 10x right/left; verbal cues for level pelvis/shoulders 4 inch lateral step ups 5 sec isometric hold 10x right/left    11/05/23 4 inch step ups 15x forward right/left (added to HEP) Knee extension with green band isometric  10x right/left with 5 sec hold (added to HEP) Standing TKE medium purple power band 5 sec hold right only 10x Manual therapy: soft tissue mobilization to bil lumbar musculature and right gluteals Trigger Point Dry Needling Subsequent Treatment: Pt instructed on Dry Needling rational, procedures, and possible side effects. Pt instructed to expect mild to moderate muscle soreness later in the day and/or into the next day.  Pt instructed in methods  to reduce muscle soreness. Pt instructed to continue prescribed HEP. Patient Verbal Consent Given: Yes Education Handout Provided: Previously Provided Muscles Treated: bil lumbar multifidi, right gluteals  Electrical Stimulation Performed: yes 80 pps 1.5 ma 8 min right L5 and right gluteals Treatment Response/Outcome: improved soft tissue mobility                 PATIENT EDUCATION:  Education details: Educated patient on anatomy and physiology of current symptoms, prognosis, plan of care as well as initial self care strategies to promote recovery Person educated: Patient Education method: Explanation Education comprehension: verbalized understanding  HOME EXERCISE PROGRAM: Access Code: [email or web address] URL: [email or web address] Date: 12/03/2023 Prepared by: Glade Pesa  Exercises - Prone Gluteal Sets  - 1 x daily - 7 x weekly - 1 sets - 10 reps - 5 hold - Supine Bent  Knee Foot Taps  - 1 x daily - 7 x weekly - 1 sets - 10 reps - Single Leg Stance  - 1 x daily - 7 x weekly - 1 sets - 5 reps - 5-10 hold - Sit to Stand  - 1 x daily - 7 x weekly - 1 sets - 5 reps - Lateral Step Up  - 1 x daily - 7 x weekly - 1 sets - 10 reps - 5 hold - Forward T with Counter Support  - 1 x daily - 7 x weekly - 1 sets - 10 reps - Dead Bug  - 1 x daily - 7 x weekly - 1 sets - 10 reps - Standing Marching  - 1 x daily - 7 x weekly - 1 sets - 10 reps - 5 hold - Forward Step Up  - 1 x daily - 7 x weekly - 1 sets - 10 reps - Sitting Knee Extension with Resistance  - 1 x daily - 7 x weekly - 1 sets - 10 reps - Seated Hamstring Curl with Anchored Resistance  - 1 x daily - 7 x weekly - 1 sets - 10 reps - 5 hold - Standing Terminal Knee Extension with Resistance  - 1 x daily - 7 x weekly - 1 sets - 10 reps - 5 hold - Standing Isometric Hip Abduction with Ball on Wall  - 1 x daily - 7 x weekly - 1 sets - 5 reps - 5 hold - Standing Hip Extension  - 1 x daily - 7 x weekly - 1 sets - 5-10 reps - 5 hold -  Lateral Step Up with Counter Support  - 1 x daily - 7 x weekly - 1 sets - 10 reps - Standing Hip Abduction with Resistance at Thighs  - 1 x daily - 7 x weekly - 1 sets - 10 reps  ASSESSMENT:  CLINICAL IMPRESSION: Sleep is 60% better.  Can walk as long as I want but not on an incline.  The patient would benefit from a continuation of skilled PT for a further progression of strengthening and functional mobility.  She will be traveling to [place]  where her husband is having replacement surgery so frequency of visits may be affected.  Will continue to update and promote independence in a HEP needed for a return to the highest functional level possible with ADLs.       OBJECTIVE IMPAIRMENTS: decreased activity tolerance, decreased mobility, difficulty walking, decreased strength, increased fascial restrictions, impaired perceived functional ability, and pain.   ACTIVITY LIMITATIONS: squatting, sleeping, bed mobility, and locomotion level  PARTICIPATION LIMITATIONS: meal prep, cleaning, laundry, and driving  PERSONAL FACTORS: Time since onset of injury/illness/exacerbation and 1-2 comorbidities: spinal surgeries, knee pain are also affecting patient's functional outcome.   REHAB POTENTIAL: Good  CLINICAL DECISION MAKING: Stable/uncomplicated  EVALUATION COMPLEXITY: Low   GOALS: Goals reviewed with patient? Yes  SHORT TERM GOALS: Target date: 11/06/2023  The patient will demonstrate knowledge of basic self care strategies and exercises to promote healing  Baseline: Goal status: met 5/27  2.  The patient will have improved hip strength to at least 4/5 needed for standing, walking longer distances and descending stairs at home and in the community  Baseline:  Goal status: met 5/27  3.  Improved quality of sleep by 30% Baseline:  Goal status: met 5/29  4.  30% improvement in pain with turning over in bed Baseline:  Goal status: met 5/29  LONG TERM GOALS: Target date:  02/25/2024   The patient will be independent in a safe self progression of a home exercise program to promote further recovery of function  Baseline:  Goal status: ongoing 2.  The patient will have improved hip strength to at least 4+/5 needed for standing, walking longer distances and descending stairs at home and in the community Baseline:  Goal status: partially met   3.  Improved quality of sleep by 60% Baseline:  Goal status: met 6/24 4.  Patient will be able to return to a regular walking program  Baseline:  Goal status: met 6/24  5.  LEFS improve to   73  /80 indicating improved function with less pain Baseline:  Goal status: ongoing   PLAN:  PT FREQUENCY: 1x/week   PT DURATION: 12 weeks  PLANNED INTERVENTIONS: 97164- PT Re-evaluation, 97110-Therapeutic exercises, 97530- Therapeutic activity, 97112- Neuromuscular re-education, 97535- Self Care, 02859- Manual therapy, [phone]- Aquatic Therapy, G0283- Electrical stimulation (unattended), [id number]- Electrical stimulation (manual), [id number]- Ultrasound, 02966- Ionotophoresis 4mg /ml Dexamethasone, Patient/Family education, Balance training, Stair training, Taping, Dry Needling, Joint mobilization, Spinal manipulation, Cryotherapy, and Moist heat  PLAN FOR NEXT SESSION: review and progress HEP; DN with ES to glueals, ITB, gluteal strengthening; decrease frequency to 1x/week; travel to [place]  for husband's THR  Glade Pesa, PT 12/03/23 8:02 PM Phone: [phone] Fax: [phone]

## 2023-12-06 ENCOUNTER — Encounter: Admitting: Physical Therapy

## 2023-12-10 ENCOUNTER — Ambulatory Visit: Admitting: Physical Therapy

## 2023-12-10 DIAGNOSIS — M25551 Pain in right hip: Secondary | ICD-10-CM | POA: Insufficient documentation

## 2023-12-10 DIAGNOSIS — M6281 Muscle weakness (generalized): Secondary | ICD-10-CM | POA: Insufficient documentation

## 2023-12-10 DIAGNOSIS — R252 Cramp and spasm: Secondary | ICD-10-CM | POA: Diagnosis present
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
## 2023-12-12 ENCOUNTER — Encounter: Admitting: Physical Therapy

## 2023-12-31 ENCOUNTER — Ambulatory Visit: Admitting: Physical Therapy

## 2023-12-31 DIAGNOSIS — M25551 Pain in right hip: Secondary | ICD-10-CM | POA: Diagnosis not present

## 2023-12-31 DIAGNOSIS — M6281 Muscle weakness (generalized): Secondary | ICD-10-CM

## 2023-12-31 DIAGNOSIS — R252 Cramp and spasm: Secondary | ICD-10-CM
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
## 2024-01-28 ENCOUNTER — Ambulatory Visit: Admitting: Physical Therapy

## 2024-01-28 DIAGNOSIS — R252 Cramp and spasm: Secondary | ICD-10-CM | POA: Insufficient documentation

## 2024-01-28 DIAGNOSIS — M25551 Pain in right hip: Secondary | ICD-10-CM | POA: Diagnosis present

## 2024-01-28 DIAGNOSIS — M6281 Muscle weakness (generalized): Secondary | ICD-10-CM | POA: Insufficient documentation
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
## 2024-01-30 IMAGING — US US ABDOMEN LIMITED
1 series · 14 of 25 positions shown · non-contrast
Comparison: None Available.

CLINICAL DATA: Nausea

EXAM:
ULTRASOUND ABDOMEN LIMITED RIGHT UPPER QUADRANT

[Series 1: us abdomen limited · 0.14mm/px · 14 of 57 slices shown]
[im 1/57]
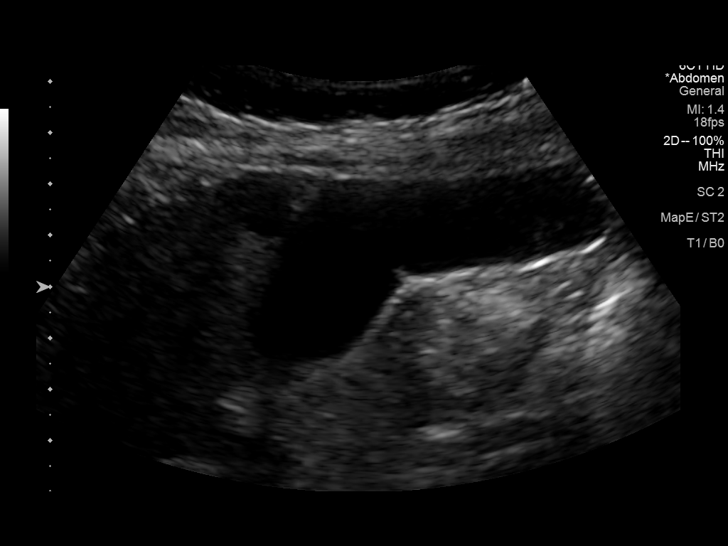
[im 5/57]
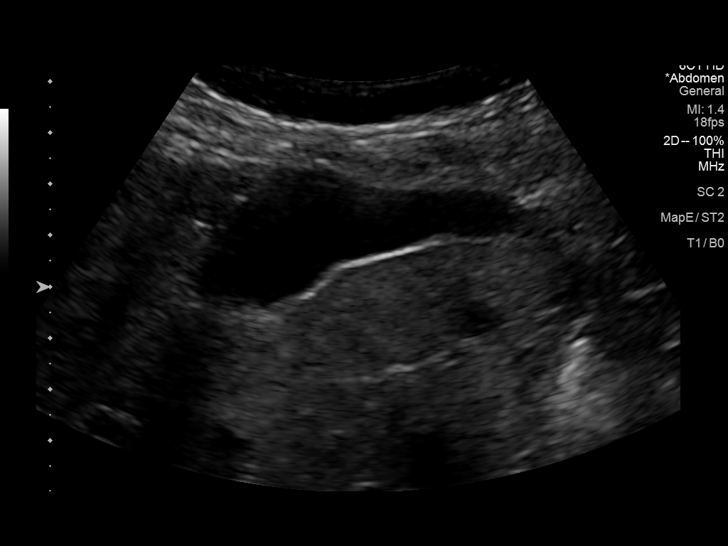
[im 10/57]
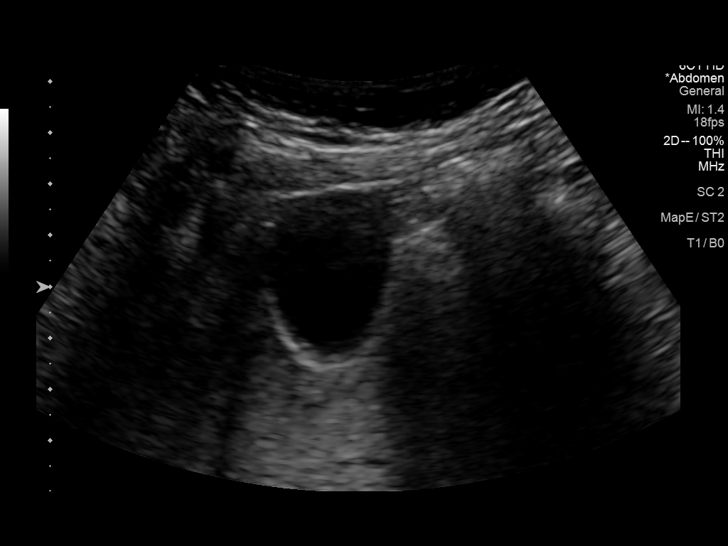
[im 15/57]
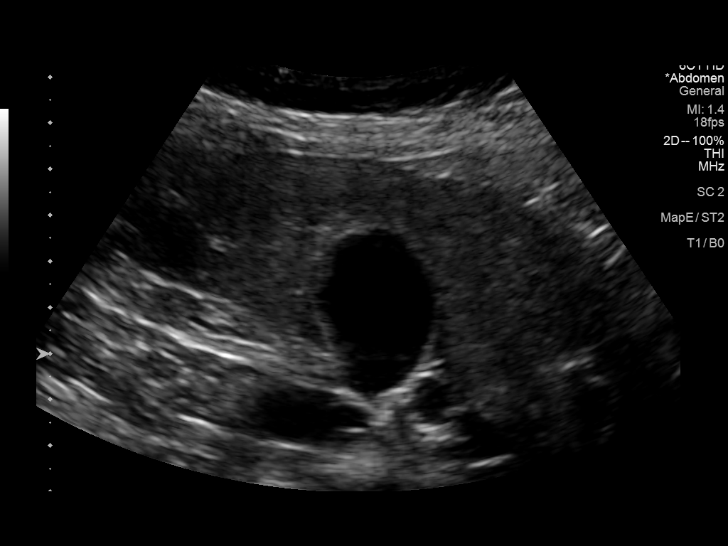
[im 19/57]
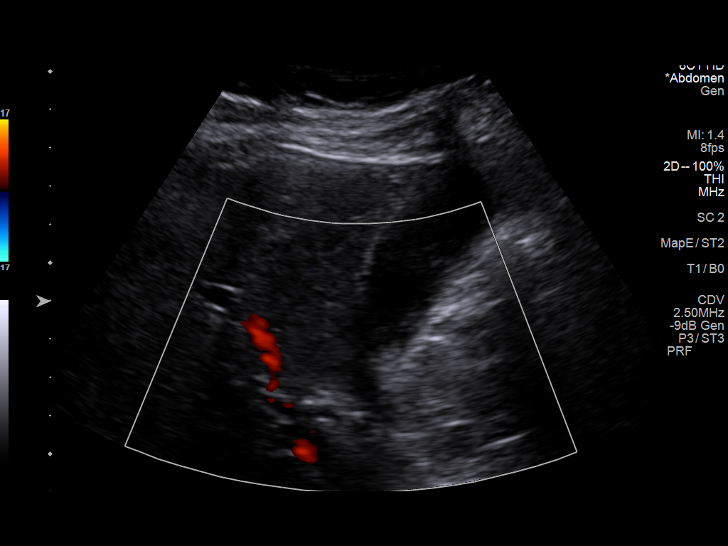
[im 22/57]
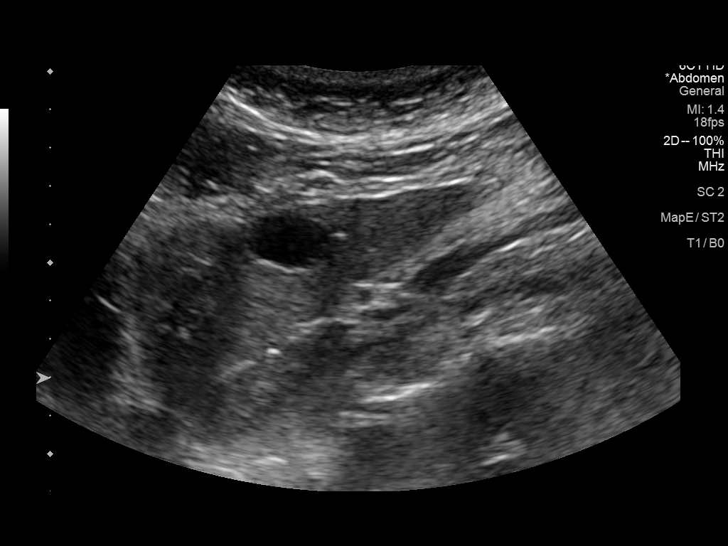
[im 26/57]
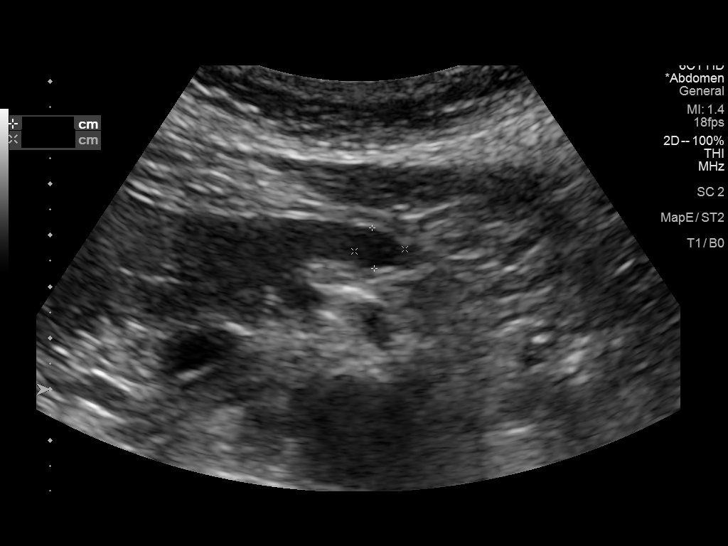
[im 31/57]
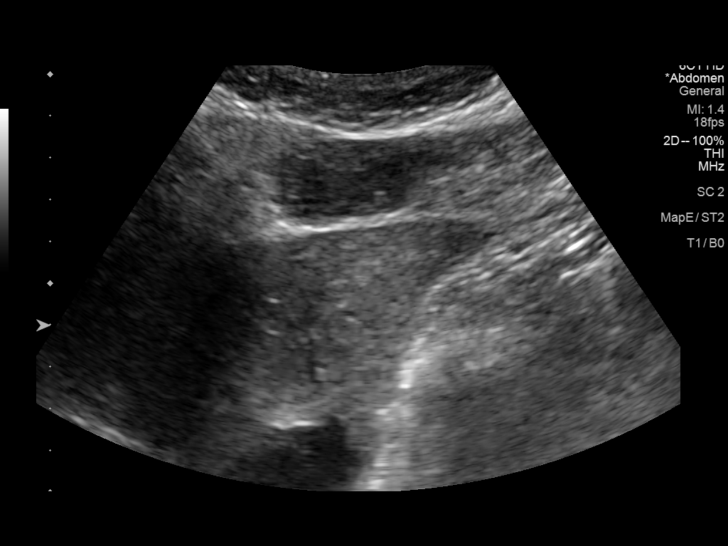
[im 36/57]
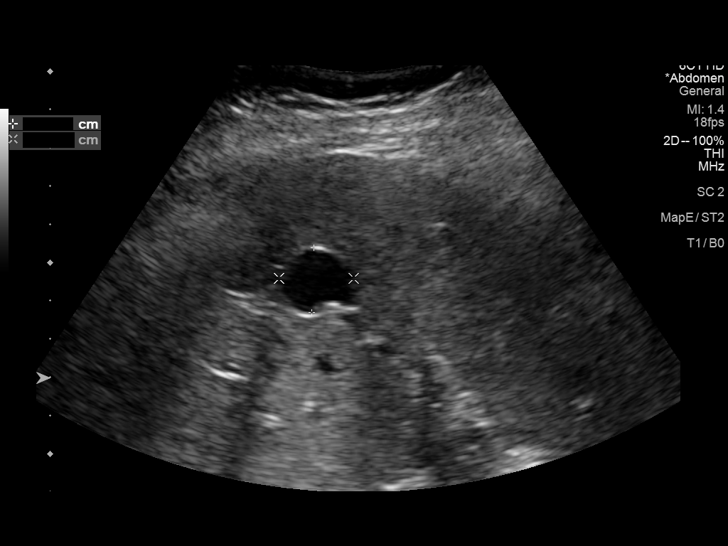
[im 38/57]
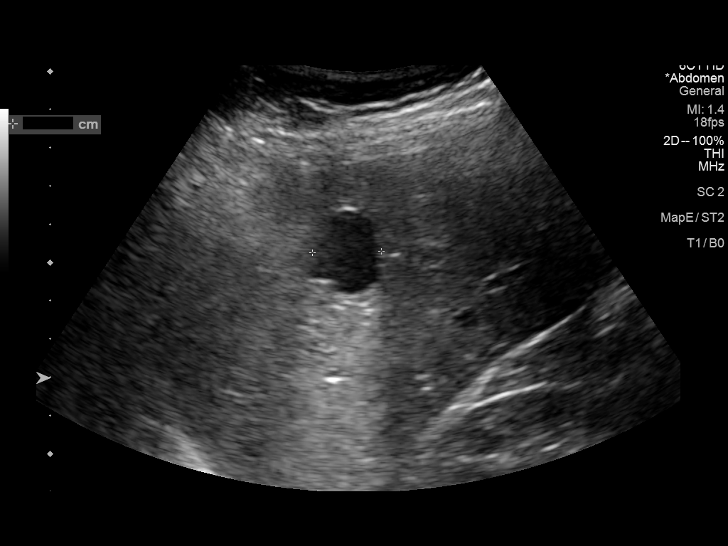
[im 43/57]
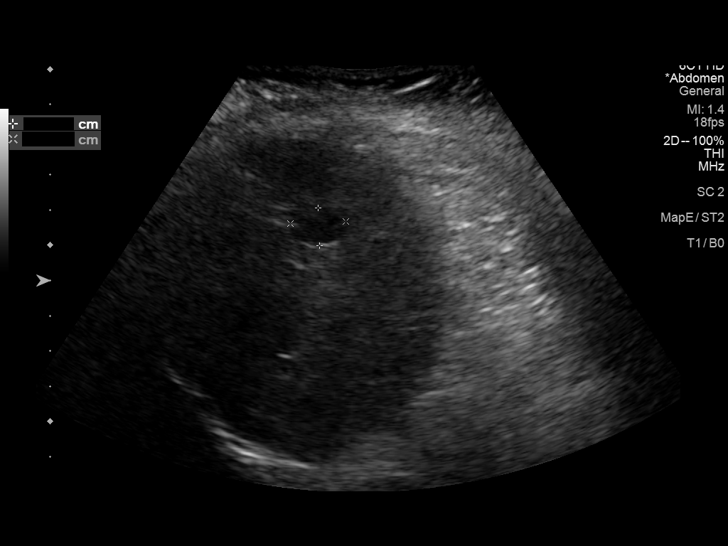
[im 47/57]
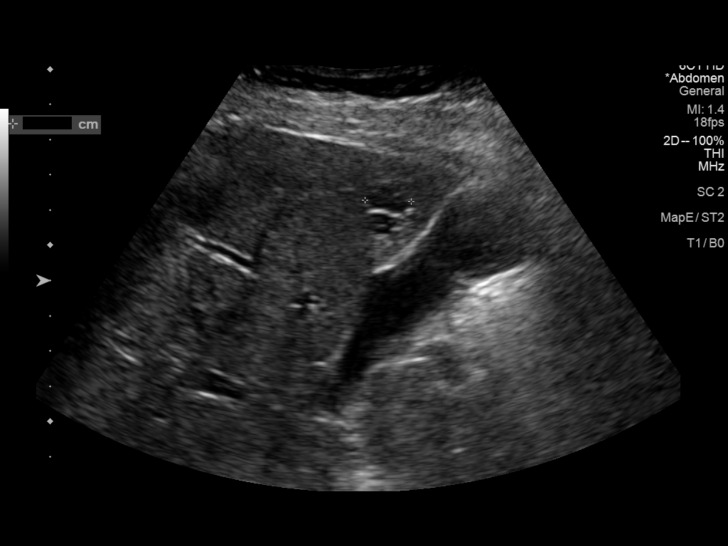
[im 52/57]
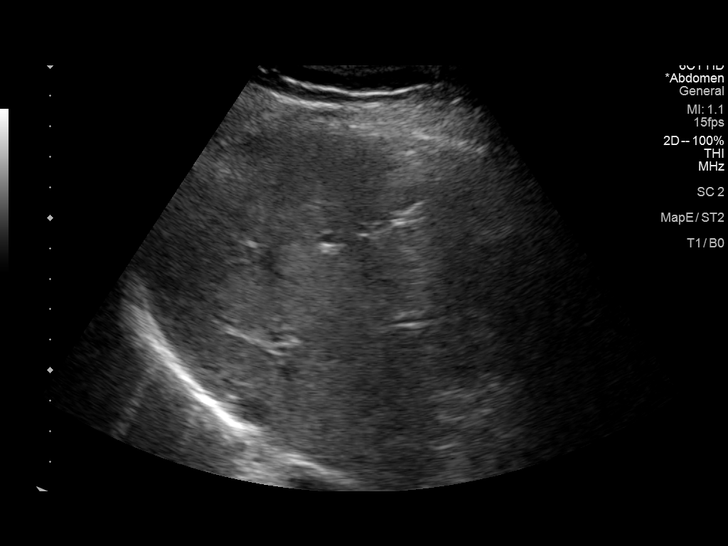
[im 57/57]
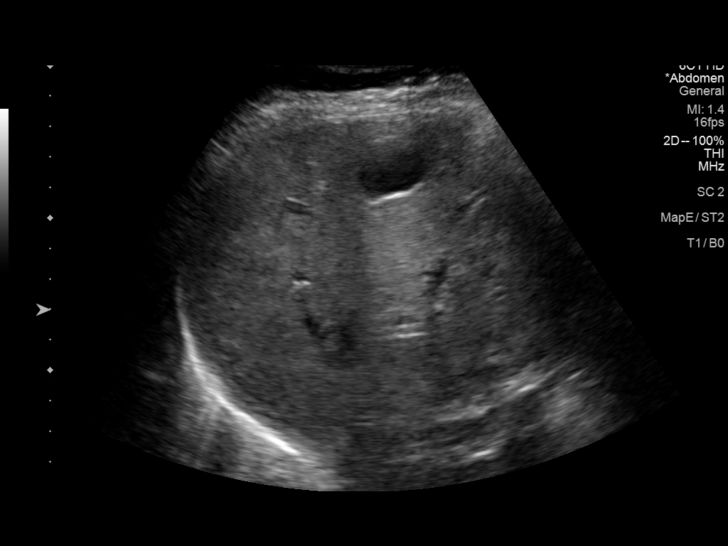

[14 of 25 positions shown; findings below may reference images not displayed]

FINDINGS: Gallbladder:

No gallstones or wall thickening visualized. No sonographic Murphy
sign noted by sonographer.

Common bile duct:

Diameter: 5 mm

Liver:

Multiple cysts within the liver. Within the left hepatic lobe there
is a 2.3 x 1.9 x 1.8 cm cyst. Within the right hepatic lobe there is
a 2.0 x 1.9 x 1.7 cm cyst. Additional cysts are demonstrated
throughout the liver. Increased echogenicity. Portal vein is patent
on color Doppler imaging with normal direction of blood flow towards
the liver.

Other: None.
IMPRESSION: No acute process.

No cholelithiasis or sonographic evidence for acute cholecystitis.

Increased hepatic parenchymal echogenicity suggestive of steatosis.

Multiple hepatic cysts.

## 2024-02-13 ENCOUNTER — Ambulatory Visit: Admitting: Physical Therapy

## 2024-02-20 ENCOUNTER — Ambulatory Visit: Admitting: Physical Therapy

## 2024-02-20 DIAGNOSIS — M25551 Pain in right hip: Secondary | ICD-10-CM | POA: Insufficient documentation

## 2024-02-20 DIAGNOSIS — M6281 Muscle weakness (generalized): Secondary | ICD-10-CM | POA: Diagnosis present

## 2024-02-20 DIAGNOSIS — R252 Cramp and spasm: Secondary | ICD-10-CM | POA: Diagnosis present

## 2024-02-20 NOTE — Therapy (Signed)
 OUTPATIENT PHYSICAL THERAPY LOWER EXTREMITY PROGRESS NOTE   Patient Name: Abigail Pruitt MRN: [id number] DOB:01-24-1946, 78 y.o., female Today's Date: 02/20/2024    END OF SESSION:  PT End of Session - 02/20/24 0848     Visit Number 13    Date for PT Re-Evaluation 02/25/24    Authorization Type Medicare/AARP       ABN signed for DN 7/22    Progress Note Due on Visit 20    PT Start Time 0846    PT Stop Time 0928    PT Time Calculation (min) 42 min    Activity Tolerance Patient tolerated treatment well          Past Medical History:  Diagnosis Date   Colon polyps    Family hx of colon cancer    Hot flashes    Severe hot flashes   Hyperlipidemia    Osteoarthritis    No past surgical history on file. 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 (Mid Oct)  Had several days with no pain in knee but Tuesday and Wed it was the worst day I've had. My back has been pretty good and not pulling on the hip.     PERTINENT HISTORY: History of spinal stenosis with surgery 04/25/2023 L3-4 fusion, good response Knee gel injections 3x (better) Cervical fusion  PAIN:   Are you having pain? Yes NPRS scale:  2/10, last night it was 11/10 in left knee Pain location: bil low back pain, right buttock pain; left knee pain; right knee has settled down Pain orientation: Right  PAIN TYPE: throbbing Pain description: intermittent  Relieving factors: fine during the day Aggravating factors:  I fight the bed all night long worse with either side; supine lying not good for breathing; my low back is  tight;  lying on affected side, trouble walking b/c of right knee not this problem     PRECAUTIONS: None    WEIGHT BEARING RESTRICTIONS: No  FALLS:  Has patient fallen in last 6 months? No  LIVING ENVIRONMENT: Lives with: lives with their spouse Lives in: House/apartment    OCCUPATION: retired  PLOF: Independent  PATIENT GOALS: I want this IT band to behave; carry on with active lifestyle  NEXT MD VISIT: in [place]   FUNCTIONAL OUTCOME MEASURE:  LEFS    69  /80 6/24:  55/80  POSTURE:  static standing posture symmetrical  PALPATION: tenderness to palpation of proximal HS, trochanteric bursa  TRUNK ROM:  WFLs in all planes; +right LE neural tension   TRUNK STRENGTH:  Decreased activation of transverse abdominus muscles; abdominals 4-/5; decreased activation of lumbar multifidi; trunk extensors 4-/5  HIP ROM:  patient has full hip ROM with tendency toward hypermobility   STRENGTH:  right hip flexion 4+/5, hip abduction 4-/5; hip extension 4-/5 painful; knee flexion and extension 4+/5 5/27: right hip abduction and extension 4/5 6/24: right hip abduction 4/5 and extension 4+/5  SPECIAL TESTS:   Single leg stance test    5  sec on each side;  + Trendelenburg sign (contralateral hip drop)  FUNCTIONAL TESTS:  5X Sit to stand test  14.06 noted hip internal rotation/genu valgus/medial collapse  2 MWT 387 feet            GAIT: tends to externally rotate right LE  TREATMENT DATE: 02/20/24 Status update on current HEP and activity level Prone modified planks on knees/elbows 10x, with hold 5 sec 8x  Plank on tall table: hip extension, hip circles 3 sets of 5 (Added to HEP- see below) Hip machine 40#: hip flexion, abduction, extension 10x right/left  Side bridge/plank 8x right/left (Added to HEP- see below) Standing tall table side plank 2 sets of 5; added hip abduction outer leg 2 sets of 5 (Added to HEP- see below)   TREATMENT DATE: 01/28/24 Status update on current HEP  and activity level 4 inch lateral step ups with opposite hip abduction at the top 10x right/left  Hip extension isometric against the wall 5 sec hold 10x right/left   Hip machine 40#: hip flexion, abduction, extension 10x right/left  Seated green ball roll outs 10x for lumbar fascial stretch Supine core activation: heel taps to roll 10x; when all the way to table feels in back Prone modified planks on knees/elbows 10x, with hold 5 sec 8x (no low back pain)  TREATMENT DATE: 12/31/23 Status update on current HEP and activity level Discussed gym equipment appropriate while in [place] Hip machine 40#: hip flexion, abduction, extension 10x right/left  Trigger Point Dry Needling Initial Treatment: Pt instructed on Dry Needling rational, procedures, and possible side effects. Pt instructed to expect mild to moderate muscle soreness later in the day and/or into the next day.  Pt instructed in methods to reduce muscle soreness. Pt instructed to continue prescribed HEP. Patient Verbal Consent Given: Yes Education Handout Provided: Previously Provided pt has had at this clinic numerous times over the years Muscles Treated: bil lumbar multifidi, right gluteals (glutes performed in sidelying and prone) Electrical Stimulation Performed:  yes 80 pps 1.5 ma 10 min right L5 and right gluteals Treatment Response/Outcome: improved soft tissue mobility Treatment Response/Outcome: improved soft tissue mobility     PATIENT EDUCATION:  Education details: Educated patient on anatomy and physiology of current symptoms, prognosis, plan of care as well as initial self care strategies to promote recovery Person educated: Patient Education method: Explanation Education comprehension: verbalized understanding  HOME EXERCISE PROGRAM: Access Code: [facility] URL: [email or web address] Date: 02/20/2024 Prepared by: Glade Pesa  Exercises - Prone Gluteal Sets  - 1 x daily - 7 x weekly - 1 sets - 10  reps - 5 hold - Supine Bent Knee Foot Taps  - 1 x daily - 7 x weekly - 1 sets - 10 reps - Single Leg Stance  - 1 x daily - 7 x weekly - 1 sets - 5 reps - 5-10 hold - Sit to Stand  - 1 x daily - 7 x weekly - 1 sets - 5 reps - Lateral Step Up  - 1 x daily - 7 x weekly - 1 sets - 10 reps - 5 hold - Forward T with Counter Support  - 1 x daily - 7 x weekly - 1 sets - 10 reps - Dead Bug  - 1 x daily - 7 x weekly - 1 sets - 10 reps - Standing Marching  - 1 x daily - 7 x weekly - 1 sets - 10 reps - 5 hold - Forward Step Up  - 1 x daily - 7 x weekly - 1 sets - 10 reps -  Sitting Knee Extension with Resistance  - 1 x daily - 7 x weekly - 1 sets - 10 reps - Seated Hamstring Curl with Anchored Resistance  - 1 x daily - 7 x weekly - 1 sets - 10 reps - 5 hold - Standing Terminal Knee Extension with Resistance  - 1 x daily - 7 x weekly - 1 sets - 10 reps - 5 hold - Standing Isometric Hip Abduction with Ball on Wall  - 1 x daily - 7 x weekly - 1 sets - 5 reps - 5 hold - Standing Hip Extension  - 1 x daily - 7 x weekly - 1 sets - 5-10 reps - 5 hold - Lateral Step Up with Counter Support  - 1 x daily - 7 x weekly - 1 sets - 10 reps - Standing Hip Abduction with Resistance at Thighs  - 1 x daily - 7 x weekly - 1 sets - 10 reps - Standing Side Plank on Wall  - 1 x daily - 7 x weekly - 2 sets - 5 reps - 5 hold - Side Plank on Knees  - 1 x daily - 7 x weekly - 1 sets - 5 reps - 5 hold - Forearm Plank on Wall  - 1 x daily - 7 x weekly - 1 sets - 10 reps - 5-10 hold ASSESSMENT:  CLINICAL IMPRESSION: Able to progress strengthening ex's targeting the gluteals (particularly glute medius) as well as lumbo/pelvic core musculature.  Instructed in modifications and variations in sidelying and standing with verbal cues to optimize form.  HEP was updated to include these progressions. At the end of session she reports, I feel even better now.         OBJECTIVE IMPAIRMENTS: decreased activity tolerance, decreased  mobility, difficulty walking, decreased strength, increased fascial restrictions, impaired perceived functional ability, and pain.   ACTIVITY LIMITATIONS: squatting, sleeping, bed mobility, and locomotion level  PARTICIPATION LIMITATIONS: meal prep, cleaning, laundry, and driving  PERSONAL FACTORS: Time since onset of injury/illness/exacerbation and 1-2 comorbidities: spinal surgeries, knee pain are also affecting patient's functional outcome.   REHAB POTENTIAL: Good  CLINICAL DECISION MAKING: Stable/uncomplicated  EVALUATION COMPLEXITY: Low   GOALS: Goals reviewed with patient? Yes  SHORT TERM GOALS: Target date: 11/06/2023  The patient will demonstrate knowledge of basic self care strategies and exercises to promote healing  Baseline: Goal status: met 5/27  2.  The patient will have improved hip strength to at least 4/5 needed for standing, walking longer distances and descending stairs at home and in the community  Baseline:  Goal status: met 5/27  3.  Improved quality of sleep by 30% Baseline:  Goal status: met 5/29  4.  30% improvement in pain with turning over in bed Baseline:  Goal status: met 5/29     LONG TERM GOALS: Target date: 02/25/2024   The patient will be independent in a safe self progression of a home exercise program to promote further recovery of function  Baseline:  Goal status: ongoing 2.  The patient will have improved hip strength to at least 4+/5 needed for standing, walking longer distances and descending stairs at home and in the community Baseline:  Goal status: partially met   3.  Improved quality of sleep by 60% Baseline:  Goal status: met 6/24 4.  Patient will be able to return to a regular walking program  Baseline:  Goal status: met 6/24  5.  LEFS improve to   73  /  80 indicating improved function with less pain Baseline:  Goal status: ongoing   PLAN:  PT FREQUENCY: 1x/week   PT DURATION: 12 weeks  PLANNED  INTERVENTIONS: 97164- PT Re-evaluation, 97110-Therapeutic exercises, 97530- Therapeutic activity, 97112- Neuromuscular re-education, 97535- Self Care, 02859- Manual therapy, [phone]- Aquatic Therapy, G0283- Electrical stimulation (unattended), [phone]- Electrical stimulation (manual), [id number]- Ultrasound, 02966- Ionotophoresis 4mg /ml Dexamethasone, Patient/Family education, Balance training, Stair training, Taping, Dry Needling, Joint mobilization, Spinal manipulation, Cryotherapy, and Moist heat  PLAN FOR NEXT SESSION:  ERO vs discharge; LEFS, 5x STS; walking program; DN with ES to glueals, ITB, gluteal strengthening; core strengthening; hip machine  Glade Pesa, PT 02/20/24 9:34 AM Phone: [phone] Fax: [phone]

## 2024-02-25 ENCOUNTER — Ambulatory Visit: Admitting: Physical Therapy

## 2024-02-25 DIAGNOSIS — M6281 Muscle weakness (generalized): Secondary | ICD-10-CM

## 2024-02-25 DIAGNOSIS — M25551 Pain in right hip: Secondary | ICD-10-CM | POA: Diagnosis not present

## 2024-02-25 DIAGNOSIS — R252 Cramp and spasm: Secondary | ICD-10-CM

## 2024-02-25 NOTE — Therapy (Signed)
 OUTPATIENT PHYSICAL THERAPY LOWER EXTREMITY PROGRESS NOTE/RECERTIFICATION   Patient Name: Abigail Pruitt MRN: [id number] DOB:[birth year], 78 y.o., female Today's Date: 02/25/2024    END OF SESSION:  PT End of Session - 02/25/24 1014     Visit Number 14    Date for PT Re-Evaluation 04/07/24    Authorization Type Medicare/AARP       ABN signed for DN 9/16    Progress Note Due on Visit 20    PT Start Time 1015    PT Stop Time 1055    PT Time Calculation (min) 40 min    Activity Tolerance Patient tolerated treatment well          Past Medical History:  Diagnosis Date   Colon polyps    Family hx of colon cancer    Hot flashes    Severe hot flashes   Hyperlipidemia    Osteoarthritis    No past surgical history on file. 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  I had to put the brace back on.  Someone told me about a procedure called the [facility] knee in [place] in [facility].  Flying there to meet with him next Wednesday. I would like to DN my back today.  PERTINENT HISTORY: History of spinal stenosis with surgery 04/25/2023 L3-4 fusion, good response Knee gel injections 3x (better) Cervical fusion  PAIN:   Are you having pain? Yes NPRS scale: 3 my back is a little tight, just from doing stuff Pain location: bil low back pain across, left knee Pain orientation: Right  PAIN TYPE: throbbing Pain description: intermittent  Relieving factors: fine during the day Aggravating factors:  I fight the bed all night long worse with either side; supine lying not good for breathing;  my low back is tight;  lying on affected side, trouble walking b/c of right knee not this problem     PRECAUTIONS: None    WEIGHT BEARING RESTRICTIONS: No  FALLS:  Has patient fallen in last 6 months? No  LIVING ENVIRONMENT: Lives with: lives with their spouse Lives in: House/apartment    OCCUPATION: retired  PLOF: Independent  PATIENT GOALS: I want this IT band to behave; carry on with active lifestyle  NEXT MD VISIT: in [place]   FUNCTIONAL OUTCOME MEASURE:  LEFS    69  /80 6/24:  55/80 9/16: 61/80  POSTURE:  static standing posture symmetrical  PALPATION: tenderness to palpation of proximal HS, trochanteric bursa  TRUNK ROM:  WFLs in all planes; +right LE neural tension   TRUNK STRENGTH:  Decreased activation of transverse abdominus muscles; abdominals 4-/5; decreased activation of lumbar multifidi; trunk extensors 4-/5 9/16:  grossly 4/5  HIP ROM:  patient has full hip ROM with tendency toward hypermobility   STRENGTH:  right hip flexion 4+/5, hip abduction 4-/5; hip extension 4-/5 painful; knee flexion and extension 4+/5 5/27: right hip abduction and extension 4/5 6/24: right hip abduction 4/5 and extension 4+/5 9/16: right hip abduction 4+/5 and extension 4+/5  SPECIAL TESTS:   Single leg stance test    5  sec on each side;  + Trendelenburg sign (contralateral hip drop)  FUNCTIONAL TESTS:   5X Sit to stand test  14.06 noted hip internal rotation/genu valgus/medial collapse  2 MWT 387 feet 9/16:Deferred secondary to knee pain exacerbation            GAIT: tends to externally rotate right LE  TREATMENT DATE: 02/25/24 Status update on current symptoms and plan of care LEFS Hip machine 40#: hip flexion, abduction, extension 10x right/left  Trigger Point Dry Needling Initial Treatment: Pt instructed on Dry Needling rational, procedures, and possible side effects. Pt instructed to expect mild to moderate muscle soreness later in the day and/or into the next  day.  Pt instructed in methods to reduce muscle soreness. Pt instructed to continue prescribed HEP. Patient Verbal Consent Given: Yes Education Handout Provided: Previously Provided pt has had at this clinic numerous times over the years Muscles Treated: bil lumbar multifidi, bil gluteals Electrical Stimulation Performed:  yes 80 pps 1.5 ma 8 min right L5 and bil gluteals Treatment Response/Outcome: improved soft tissue mobility  TREATMENT DATE: 02/20/24 Status update on current HEP and activity level Prone modified planks on knees/elbows 10x, with hold 5 sec 8x  Plank on tall table: hip extension, hip circles 3 sets of 5 (Added to HEP- see below) Hip machine 40#: hip flexion, abduction, extension 10x right/left  Side bridge/plank 8x right/left (Added to HEP- see below) Standing tall table side plank 2 sets of 5; added hip abduction outer leg 2 sets of 5 (Added to HEP- see below)   TREATMENT DATE: 01/28/24 Status update on current HEP and activity level 4 inch lateral step ups with opposite hip abduction at the top 10x right/left  Hip extension isometric against the wall 5 sec hold 10x right/left   Hip machine 40#: hip flexion, abduction, extension 10x right/left  Seated green ball roll outs 10x for lumbar fascial stretch Supine core activation: heel taps to roll 10x; when all the way to table feels in back Prone modified planks on knees/elbows 10x, with hold 5 sec 8x (no low back pain)  TREATMENT DATE: 12/31/23 Status update on current HEP and activity level Discussed gym equipment appropriate while in [place] Hip machine 40#: hip flexion, abduction, extension 10x right/left  Trigger Point Dry Needling Initial Treatment: Pt instructed on Dry Needling rational, procedures, and possible side effects. Pt instructed to expect mild to moderate muscle soreness later in the day and/or into the next day.  Pt instructed in methods to reduce muscle soreness. Pt instructed to continue prescribed  HEP. Patient Verbal Consent Given: Yes Education Handout Provided: Previously Provided pt has had at this clinic numerous times over the years Muscles Treated: bil lumbar multifidi, right gluteals (glutes performed in sidelying and prone) Electrical Stimulation Performed:  yes 80 pps 1.5 ma 10 min right L5 and right gluteals Treatment Response/Outcome: improved soft tissue mobility Treatment Response/Outcome: improved soft tissue mobility     PATIENT EDUCATION:  Education details: Educated patient on anatomy and physiology of current symptoms, prognosis, plan of care as well as initial self care strategies to promote recovery Person educated: Patient Education method: Explanation Education comprehension: verbalized understanding  HOME EXERCISE PROGRAM: Access Code: [facility] URL: [email or web address] Date: 02/20/2024 Prepared by: Glade Pesa  Exercises - Prone Gluteal Sets  - 1 x daily - 7 x weekly - 1 sets - 10 reps - 5 hold - Supine Bent Knee Foot Taps  - 1 x daily - 7 x weekly - 1 sets -  10 reps - Single Leg Stance  - 1 x daily - 7 x weekly - 1 sets - 5 reps - 5-10 hold - Sit to Stand  - 1 x daily - 7 x weekly - 1 sets - 5 reps - Lateral Step Up  - 1 x daily - 7 x weekly - 1 sets - 10 reps - 5 hold - Forward T with Counter Support  - 1 x daily - 7 x weekly - 1 sets - 10 reps - Dead Bug  - 1 x daily - 7 x weekly - 1 sets - 10 reps - Standing Marching  - 1 x daily - 7 x weekly - 1 sets - 10 reps - 5 hold - Forward Step Up  - 1 x daily - 7 x weekly - 1 sets - 10 reps - Sitting Knee Extension with Resistance  - 1 x daily - 7 x weekly - 1 sets - 10 reps - Seated Hamstring Curl with Anchored Resistance  - 1 x daily - 7 x weekly - 1 sets - 10 reps - 5 hold - Standing Terminal Knee Extension with Resistance  - 1 x daily - 7 x weekly - 1 sets - 10 reps - 5 hold - Standing Isometric Hip Abduction with Ball on Wall  - 1 x daily - 7 x weekly - 1 sets - 5 reps - 5 hold -  Standing Hip Extension  - 1 x daily - 7 x weekly - 1 sets - 5-10 reps - 5 hold - Lateral Step Up with Counter Support  - 1 x daily - 7 x weekly - 1 sets - 10 reps - Standing Hip Abduction with Resistance at Thighs  - 1 x daily - 7 x weekly - 1 sets - 10 reps - Standing Side Plank on Wall  - 1 x daily - 7 x weekly - 2 sets - 5 reps - 5 hold - Side Plank on Knees  - 1 x daily - 7 x weekly - 1 sets - 5 reps - 5 hold - Forearm Plank on Wall  - 1 x daily - 7 x weekly - 1 sets - 10 reps - 5-10 hold ASSESSMENT:  CLINICAL IMPRESSION: Sol is having left knee pain which alters her gait and affects low back pain and hip pain.  The patient benefits significantly from dry needling combined with ES to stimulate underlying myofascial trigger points and muscular tissue for management of neuromusculoskeletal pain and address movement impairments.  Much improved soft tissue mobility and decreased tender point size and number following treatment session.  Exercises performed to target strengthening of hip musculature but needs modifications due to knee pain. The patient would benefit from a continuation of skilled PT for a further progression of strengthening and functional mobility to help prepare her for an upcoming knee surgery.     OBJECTIVE IMPAIRMENTS: decreased activity tolerance, decreased mobility, difficulty walking, decreased strength, increased fascial restrictions, impaired perceived functional ability, and pain.   ACTIVITY LIMITATIONS: squatting, sleeping, bed mobility, and locomotion level  PARTICIPATION LIMITATIONS: meal prep, cleaning, laundry, and driving  PERSONAL FACTORS: Time since onset of injury/illness/exacerbation and 1-2 comorbidities: spinal surgeries, knee pain are also affecting patient's functional outcome.   REHAB POTENTIAL: Good  CLINICAL DECISION MAKING: Stable/uncomplicated  EVALUATION COMPLEXITY: Low   GOALS: Goals reviewed with patient? Yes  SHORT TERM GOALS: Target  date: 11/06/2023  The patient will demonstrate knowledge of basic self care strategies and exercises to  promote healing  Baseline: Goal status: met 5/27  2.  The patient will have improved hip strength to at least 4/5 needed for standing, walking longer distances and descending stairs at home and in the community  Baseline:  Goal status: met 5/27  3.  Improved quality of sleep by 30% Baseline:  Goal status: met 5/29  4.  30% improvement in pain with turning over in bed Baseline:  Goal status: met 5/29     LONG TERM GOALS: Target date: 04/07/2024    The patient will be independent in a safe self progression of a home exercise program to promote further recovery of function  Baseline:  Goal status: ongoing  2.  The patient will have improved hip strength to at least 5-/5 needed for standing, walking longer distances and descending stairs at home and in the community and to prepare for upcoming knee surgery Baseline:  Goal status: revised/partially met   3.  Improved quality of sleep by 60% Baseline:  Goal status: met 6/24 4.  Patient will be able to return to a regular walking program  Baseline:  Goal status: met 6/24  5.  LEFS improve to   73  /80 indicating improved function with less pain Baseline:  Goal status: ongoing   PLAN:  PT FREQUENCY: 1x/week   PT DURATION: 6 weeks  PLANNED INTERVENTIONS: 97164- PT Re-evaluation, 97110-Therapeutic exercises, 97530- Therapeutic activity, 97112- Neuromuscular re-education, 97535- Self Care, 02859- Manual therapy, [phone]- Aquatic Therapy, G0283- Electrical stimulation (unattended), [phone]- Electrical stimulation (manual), [id number]- Ultrasound, 02966- Ionotophoresis 4mg /ml Dexamethasone, Patient/Family education, Balance training, Stair training, Taping, Dry Needling, Joint mobilization, Spinal manipulation, Cryotherapy, and Moist heat  PLAN FOR NEXT SESSION: tapering of visits;  DN with ES to glueals, ITB, gluteal strengthening;  core strengthening; hip machine; pt may have upcoming knee surgery in [facility]  Glade Pesa, PT 02/25/24 10:56 AM Phone: [phone] Fax: [phone]
